# Patient Record
Sex: Female | Born: 1935 | Race: White | Hispanic: No | Marital: Married | Smoking: Never smoker
Health system: Southern US, Community
[De-identification: ages and names within clinical notes are randomized; demographics above are authoritative.]

## PROBLEM LIST (undated history)

## (undated) DIAGNOSIS — C50919 Malignant neoplasm of unspecified site of unspecified female breast: Secondary | ICD-10-CM

## (undated) DIAGNOSIS — I517 Cardiomegaly: Secondary | ICD-10-CM

## (undated) DIAGNOSIS — K5792 Diverticulitis of intestine, part unspecified, without perforation or abscess without bleeding: Secondary | ICD-10-CM

## (undated) HISTORY — DX: Malignant neoplasm of unspecified site of unspecified female breast: C50.919

## (undated) HISTORY — PX: ABDOMINAL HYSTERECTOMY: SHX81

## (undated) HISTORY — PX: CHOLECYSTECTOMY: SHX55

## (undated) HISTORY — PX: MASTECTOMY: SHX3

---

## 1998-12-11 ENCOUNTER — Other Ambulatory Visit: Admission: RE | Admit: 1998-12-11 | Discharge: 1998-12-11 | Payer: Self-pay | Admitting: Gastroenterology

## 1998-12-11 ENCOUNTER — Encounter (INDEPENDENT_AMBULATORY_CARE_PROVIDER_SITE_OTHER): Payer: Self-pay | Admitting: Specialist

## 1999-08-28 ENCOUNTER — Encounter (HOSPITAL_COMMUNITY): Payer: Self-pay | Admitting: Oncology

## 1999-08-28 ENCOUNTER — Encounter: Admission: RE | Admit: 1999-08-28 | Discharge: 1999-08-28 | Payer: Self-pay | Admitting: Oncology

## 2000-05-19 ENCOUNTER — Ambulatory Visit (HOSPITAL_COMMUNITY): Admission: RE | Admit: 2000-05-19 | Discharge: 2000-05-19 | Payer: Self-pay | Admitting: Family Medicine

## 2000-09-02 ENCOUNTER — Encounter: Admission: RE | Admit: 2000-09-02 | Discharge: 2000-09-02 | Payer: Self-pay | Admitting: Family Medicine

## 2000-09-02 ENCOUNTER — Encounter: Payer: Self-pay | Admitting: Family Medicine

## 2001-09-15 ENCOUNTER — Encounter: Payer: Self-pay | Admitting: Family Medicine

## 2001-09-15 ENCOUNTER — Encounter: Admission: RE | Admit: 2001-09-15 | Discharge: 2001-09-15 | Payer: Self-pay | Admitting: Family Medicine

## 2001-09-21 ENCOUNTER — Encounter: Payer: Self-pay | Admitting: Family Medicine

## 2001-09-21 ENCOUNTER — Encounter: Admission: RE | Admit: 2001-09-21 | Discharge: 2001-09-21 | Payer: Self-pay | Admitting: Family Medicine

## 2001-09-21 ENCOUNTER — Encounter (INDEPENDENT_AMBULATORY_CARE_PROVIDER_SITE_OTHER): Payer: Self-pay | Admitting: Specialist

## 2001-10-04 ENCOUNTER — Encounter: Payer: Self-pay | Admitting: General Surgery

## 2001-10-04 ENCOUNTER — Encounter: Admission: RE | Admit: 2001-10-04 | Discharge: 2001-10-04 | Payer: Self-pay | Admitting: General Surgery

## 2001-10-10 ENCOUNTER — Encounter (INDEPENDENT_AMBULATORY_CARE_PROVIDER_SITE_OTHER): Payer: Self-pay | Admitting: *Deleted

## 2001-10-10 ENCOUNTER — Encounter: Payer: Self-pay | Admitting: General Surgery

## 2001-10-11 ENCOUNTER — Inpatient Hospital Stay (HOSPITAL_COMMUNITY): Admission: AD | Admit: 2001-10-11 | Discharge: 2001-10-13 | Payer: Self-pay | Admitting: General Surgery

## 2002-01-04 ENCOUNTER — Encounter: Payer: Self-pay | Admitting: Urology

## 2002-01-04 ENCOUNTER — Encounter: Admission: RE | Admit: 2002-01-04 | Discharge: 2002-01-04 | Payer: Self-pay | Admitting: Urology

## 2002-02-09 ENCOUNTER — Encounter: Payer: Self-pay | Admitting: Urology

## 2002-02-09 ENCOUNTER — Ambulatory Visit (HOSPITAL_BASED_OUTPATIENT_CLINIC_OR_DEPARTMENT_OTHER): Admission: RE | Admit: 2002-02-09 | Discharge: 2002-02-09 | Payer: Self-pay | Admitting: Urology

## 2002-02-22 ENCOUNTER — Encounter: Payer: Self-pay | Admitting: Urology

## 2002-02-22 ENCOUNTER — Encounter: Admission: RE | Admit: 2002-02-22 | Discharge: 2002-02-22 | Payer: Self-pay | Admitting: Urology

## 2002-03-27 ENCOUNTER — Encounter: Payer: Self-pay | Admitting: Urology

## 2002-03-27 ENCOUNTER — Encounter: Admission: RE | Admit: 2002-03-27 | Discharge: 2002-03-27 | Payer: Self-pay | Admitting: Urology

## 2003-04-05 ENCOUNTER — Encounter: Admission: RE | Admit: 2003-04-05 | Discharge: 2003-04-05 | Payer: Self-pay | Admitting: Family Medicine

## 2003-04-16 ENCOUNTER — Ambulatory Visit (HOSPITAL_COMMUNITY): Admission: RE | Admit: 2003-04-16 | Discharge: 2003-04-16 | Payer: Self-pay | Admitting: Family Medicine

## 2003-05-10 ENCOUNTER — Encounter (INDEPENDENT_AMBULATORY_CARE_PROVIDER_SITE_OTHER): Payer: Self-pay | Admitting: *Deleted

## 2003-05-10 ENCOUNTER — Observation Stay (HOSPITAL_COMMUNITY): Admission: RE | Admit: 2003-05-10 | Discharge: 2003-05-11 | Payer: Self-pay | Admitting: General Surgery

## 2004-04-10 ENCOUNTER — Ambulatory Visit: Payer: Self-pay | Admitting: Family Medicine

## 2004-05-15 ENCOUNTER — Ambulatory Visit: Payer: Self-pay | Admitting: *Deleted

## 2004-07-15 ENCOUNTER — Ambulatory Visit: Payer: Self-pay | Admitting: Family Medicine

## 2004-09-01 ENCOUNTER — Ambulatory Visit: Payer: Self-pay | Admitting: Family Medicine

## 2004-10-13 ENCOUNTER — Ambulatory Visit: Payer: Self-pay | Admitting: Family Medicine

## 2004-12-18 ENCOUNTER — Ambulatory Visit: Payer: Self-pay | Admitting: Family Medicine

## 2005-01-22 ENCOUNTER — Ambulatory Visit: Payer: Self-pay | Admitting: Family Medicine

## 2005-01-23 ENCOUNTER — Ambulatory Visit (HOSPITAL_COMMUNITY): Admission: RE | Admit: 2005-01-23 | Discharge: 2005-01-23 | Payer: Self-pay | Admitting: Family Medicine

## 2005-01-27 ENCOUNTER — Ambulatory Visit: Payer: Self-pay | Admitting: Family Medicine

## 2005-01-29 ENCOUNTER — Ambulatory Visit: Payer: Self-pay | Admitting: Family Medicine

## 2005-01-29 ENCOUNTER — Ambulatory Visit: Payer: Self-pay | Admitting: Internal Medicine

## 2005-01-30 ENCOUNTER — Ambulatory Visit (HOSPITAL_COMMUNITY): Admission: RE | Admit: 2005-01-30 | Discharge: 2005-01-30 | Payer: Self-pay | Admitting: Family Medicine

## 2005-02-09 ENCOUNTER — Ambulatory Visit: Payer: Self-pay | Admitting: Family Medicine

## 2005-02-10 ENCOUNTER — Ambulatory Visit (HOSPITAL_COMMUNITY): Admission: RE | Admit: 2005-02-10 | Discharge: 2005-02-10 | Payer: Self-pay | Admitting: Family Medicine

## 2005-02-26 ENCOUNTER — Ambulatory Visit: Payer: Self-pay | Admitting: Gastroenterology

## 2005-03-23 ENCOUNTER — Ambulatory Visit: Payer: Self-pay | Admitting: Gastroenterology

## 2005-03-30 ENCOUNTER — Ambulatory Visit: Payer: Self-pay | Admitting: Gastroenterology

## 2005-04-01 ENCOUNTER — Ambulatory Visit: Payer: Self-pay | Admitting: Family Medicine

## 2005-04-09 ENCOUNTER — Ambulatory Visit: Payer: Self-pay | Admitting: Family Medicine

## 2005-06-04 ENCOUNTER — Ambulatory Visit: Payer: Self-pay | Admitting: Family Medicine

## 2005-07-29 ENCOUNTER — Ambulatory Visit: Payer: Self-pay | Admitting: Family Medicine

## 2005-08-10 ENCOUNTER — Ambulatory Visit: Payer: Self-pay | Admitting: Family Medicine

## 2005-09-03 ENCOUNTER — Ambulatory Visit: Payer: Self-pay | Admitting: Family Medicine

## 2005-09-10 ENCOUNTER — Ambulatory Visit: Payer: Self-pay | Admitting: Family Medicine

## 2005-11-04 ENCOUNTER — Ambulatory Visit: Payer: Self-pay | Admitting: Family Medicine

## 2005-12-16 ENCOUNTER — Ambulatory Visit: Payer: Self-pay | Admitting: Family Medicine

## 2006-02-09 ENCOUNTER — Ambulatory Visit: Payer: Self-pay | Admitting: Family Medicine

## 2006-02-18 ENCOUNTER — Ambulatory Visit: Payer: Self-pay | Admitting: Family Medicine

## 2006-03-26 ENCOUNTER — Ambulatory Visit: Payer: Self-pay | Admitting: Family Medicine

## 2006-04-28 ENCOUNTER — Ambulatory Visit: Payer: Self-pay | Admitting: Family Medicine

## 2006-05-31 ENCOUNTER — Ambulatory Visit: Payer: Self-pay | Admitting: Family Medicine

## 2006-07-05 ENCOUNTER — Ambulatory Visit: Payer: Self-pay | Admitting: Family Medicine

## 2006-07-21 ENCOUNTER — Ambulatory Visit: Payer: Self-pay | Admitting: Family Medicine

## 2006-08-12 ENCOUNTER — Ambulatory Visit: Payer: Self-pay | Admitting: Family Medicine

## 2006-09-09 ENCOUNTER — Ambulatory Visit: Payer: Self-pay | Admitting: Family Medicine

## 2006-09-23 IMAGING — CR DG SHOULDER 2+V*R*
3 series · 3 of 3 positions shown · non-contrast
Comparison: none

CLINICAL DATA: Chronic right shoulder pain.  No acute injury.   
 RIGHT SHOULDER COMPLETE ? 3 VIEWS:

[view not recorded (1 of 3)]
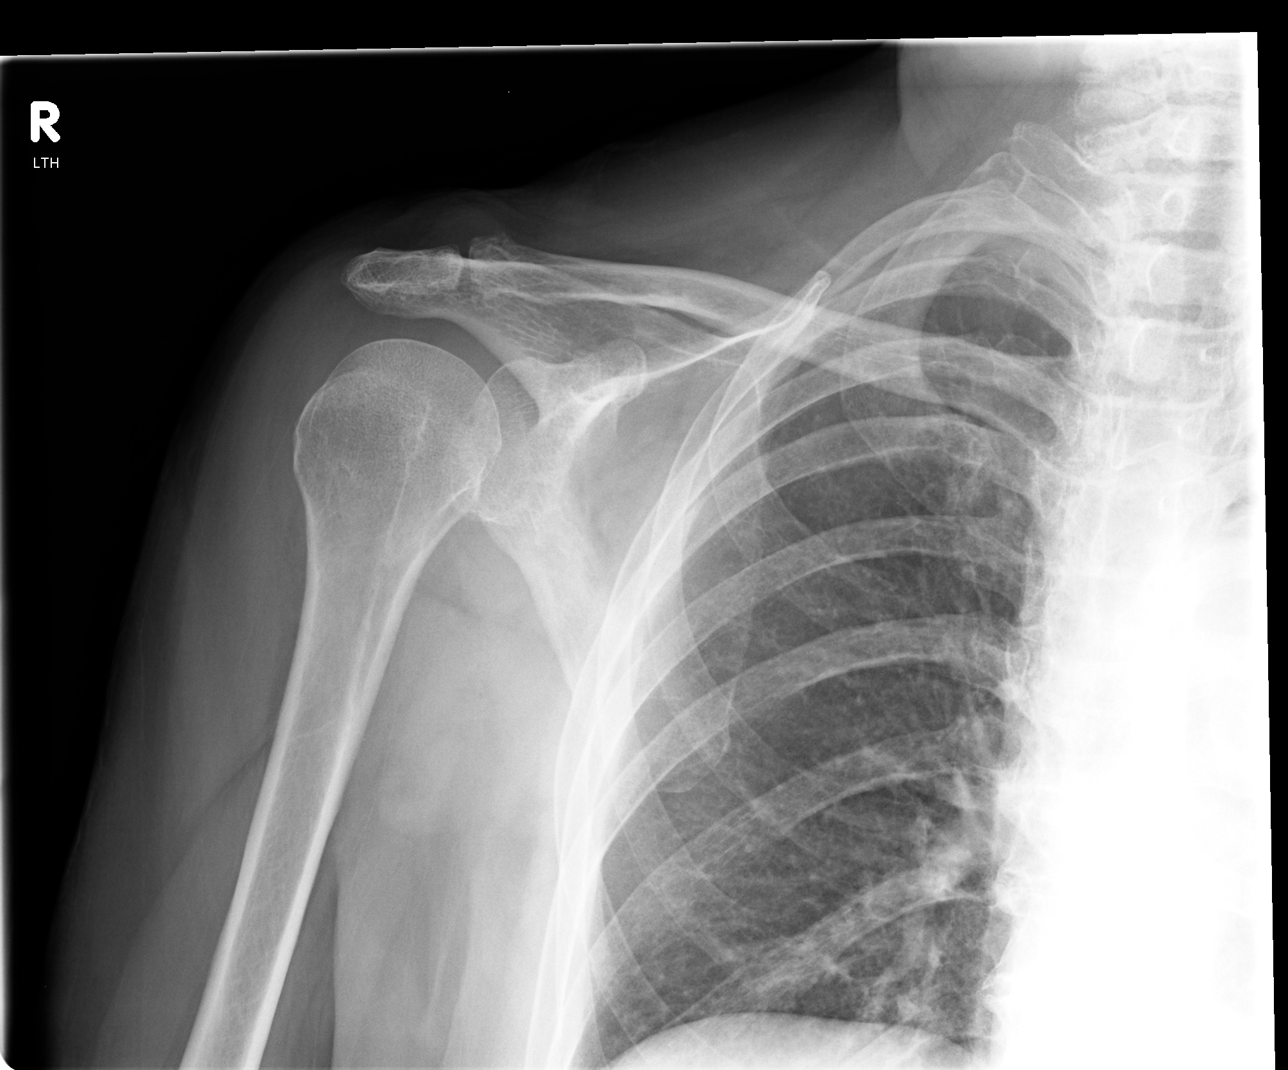

[view not recorded (2 of 3)]
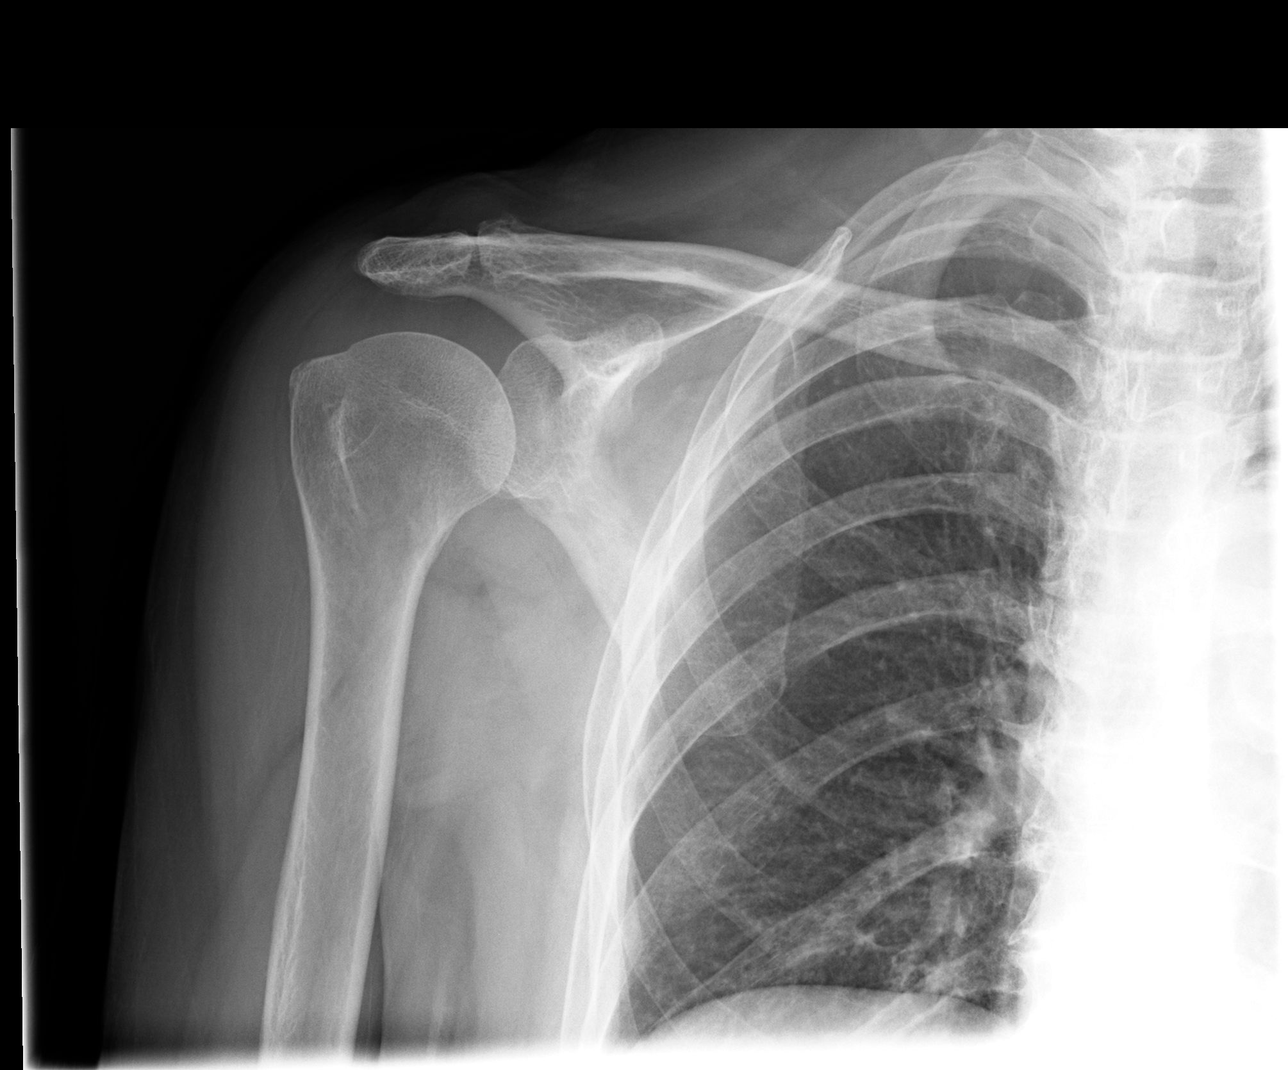

[view not recorded (3 of 3)]
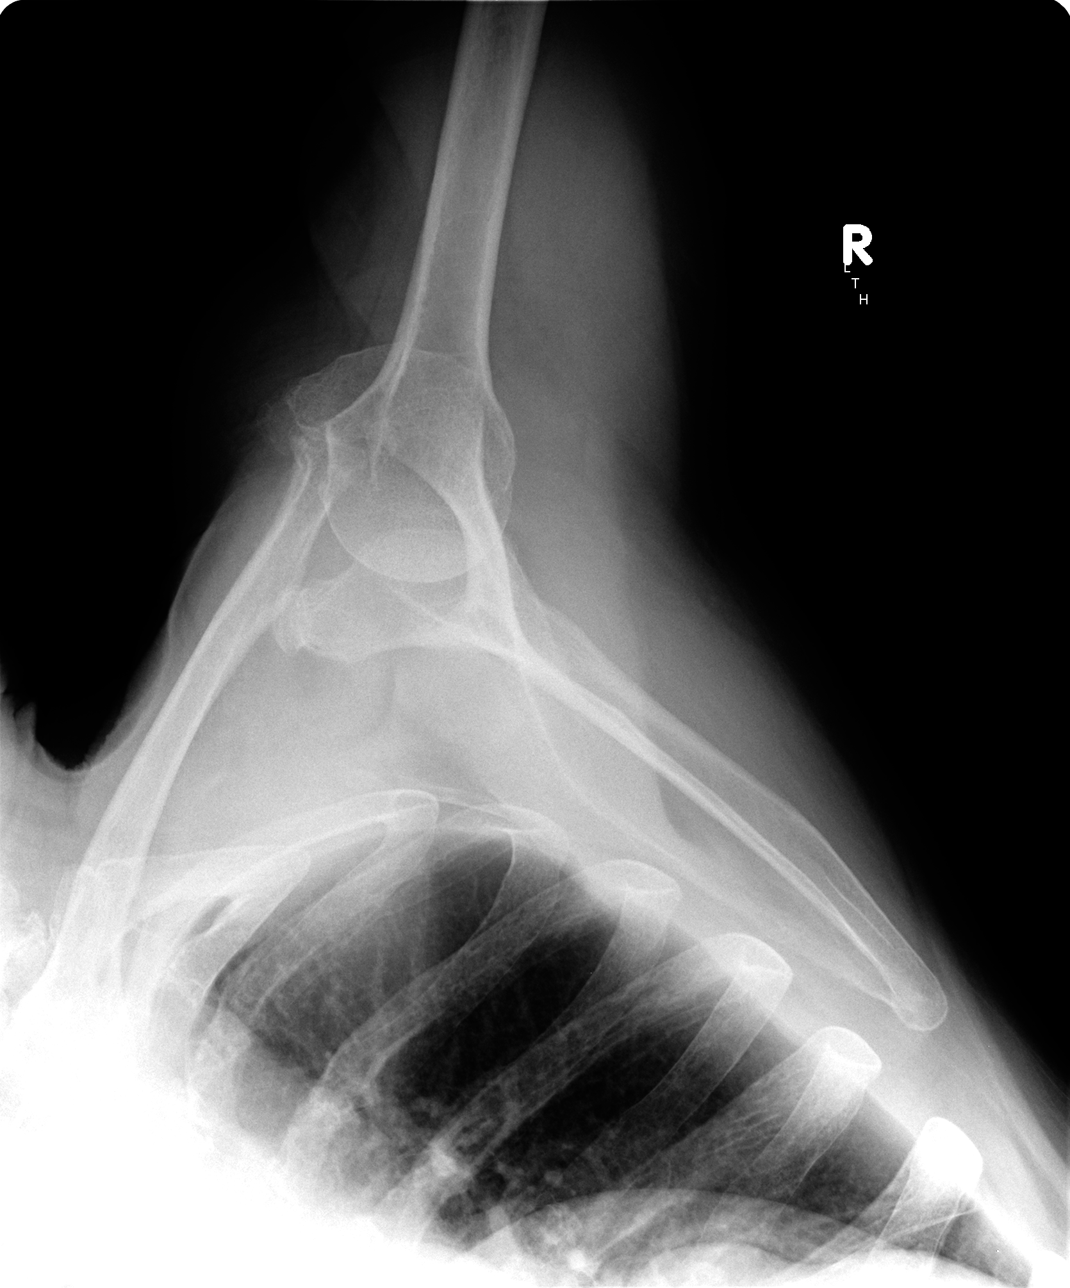

[3 of 3 positions shown; findings below may reference images not displayed]

FINDINGS: There are moderate degenerative changes of the acromioclavicular joint with as spur emanating off the inferior aspect of the distal clavicle.  This could make the patient subject to rotator cuff stress.  Is there clinical evidence for impingement?  No tendon calcification.  No acute change.
IMPRESSION: Degenerative changes of the A/C joint with a prominent spur emanating off the distal clavicle ? question impingement syndrome.

## 2006-10-19 ENCOUNTER — Ambulatory Visit: Payer: Self-pay | Admitting: Family Medicine

## 2006-10-26 ENCOUNTER — Ambulatory Visit: Payer: Self-pay | Admitting: Family Medicine

## 2006-12-28 ENCOUNTER — Emergency Department (HOSPITAL_COMMUNITY): Admission: EM | Admit: 2006-12-28 | Discharge: 2006-12-28 | Payer: Self-pay | Admitting: Emergency Medicine

## 2008-08-27 IMAGING — CR DG CHEST 2V
2 series · 2 of 2 positions shown · non-contrast
Comparison: none

CLINICAL DATA: Hypertension

Chest 2 view:
No previous for comparison. Lungs clear. Heart size upper limits normal.
Vascular clips in the right upper abdomen. No effusion. Spondylitic changes in
the thoracic spine.

[w chest pa]
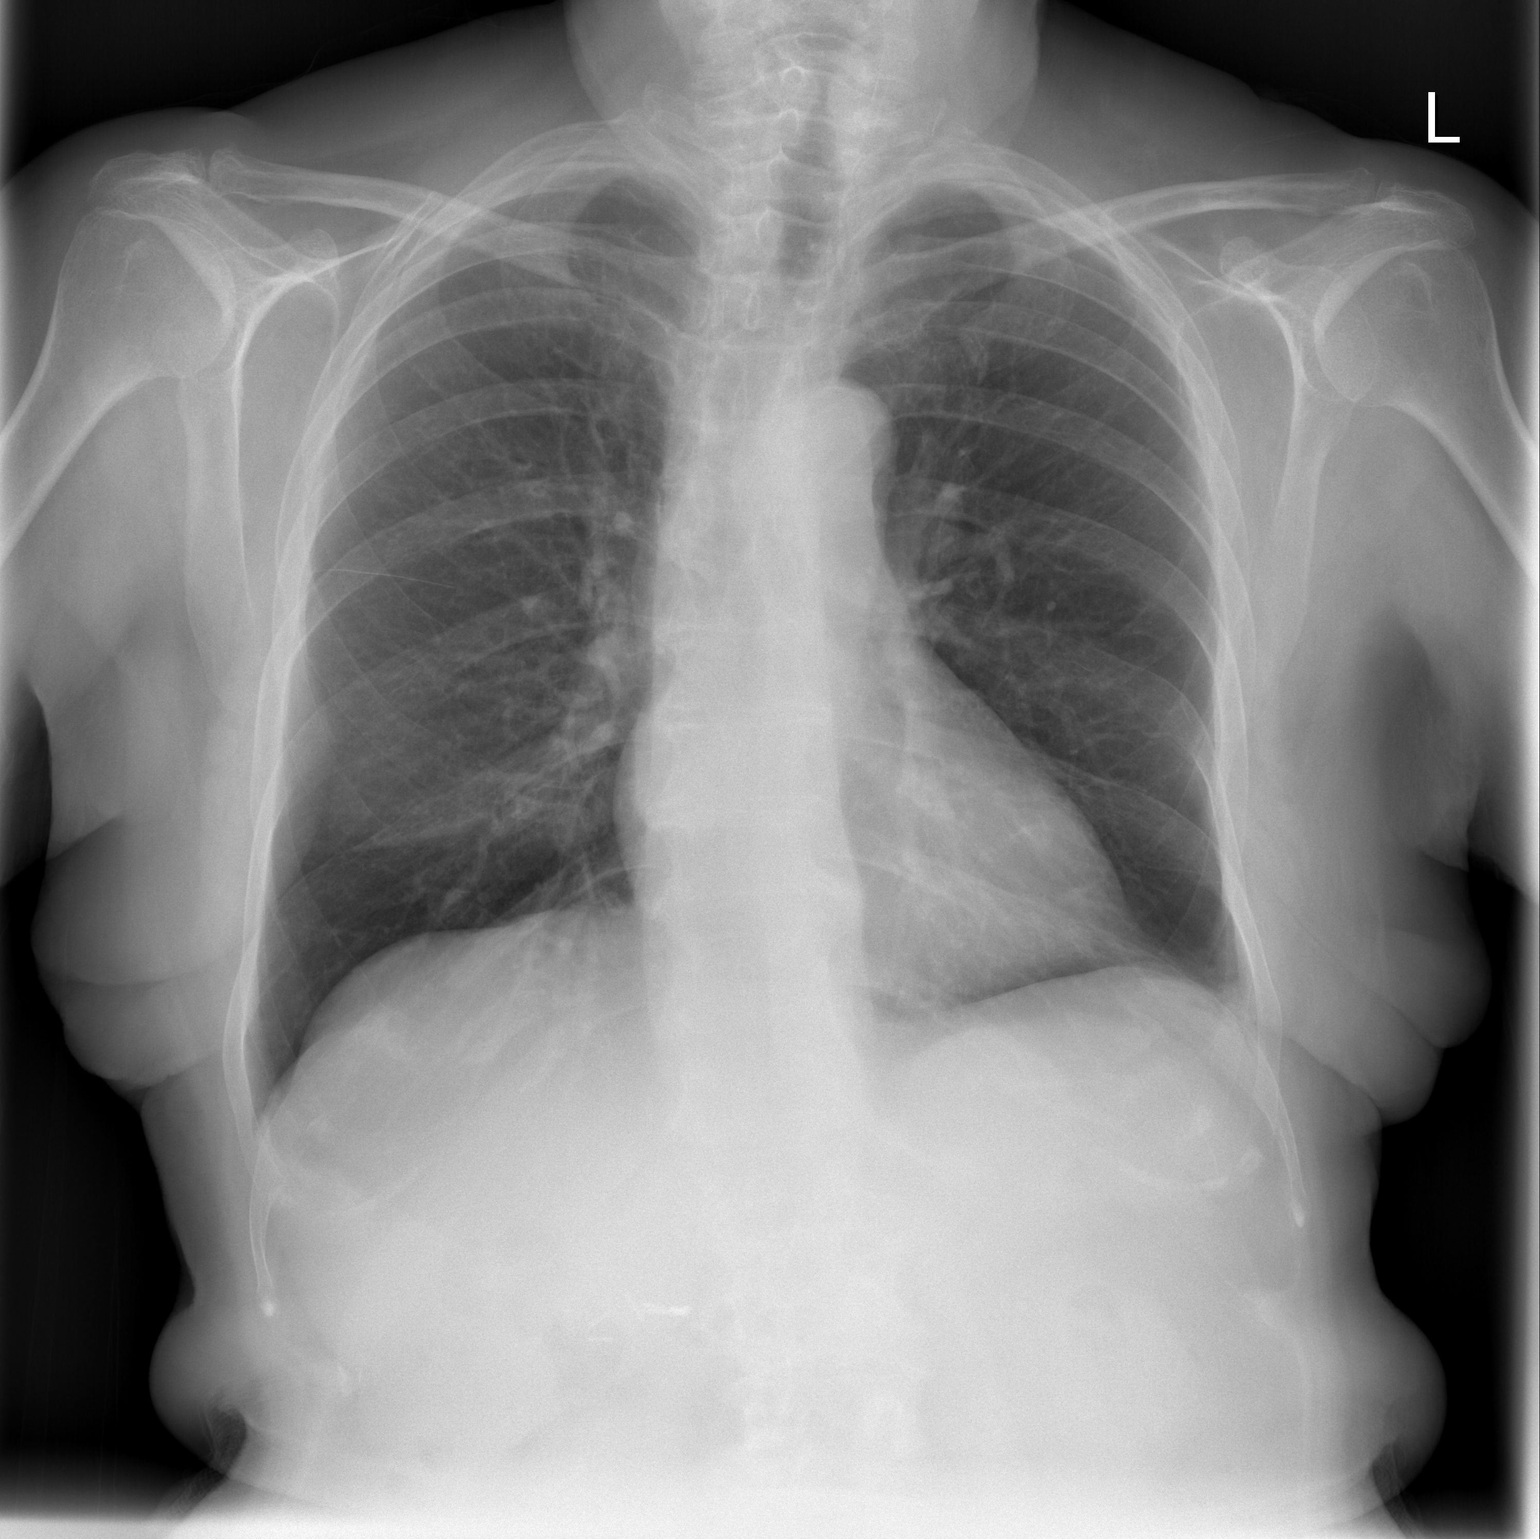

[w chest lat]
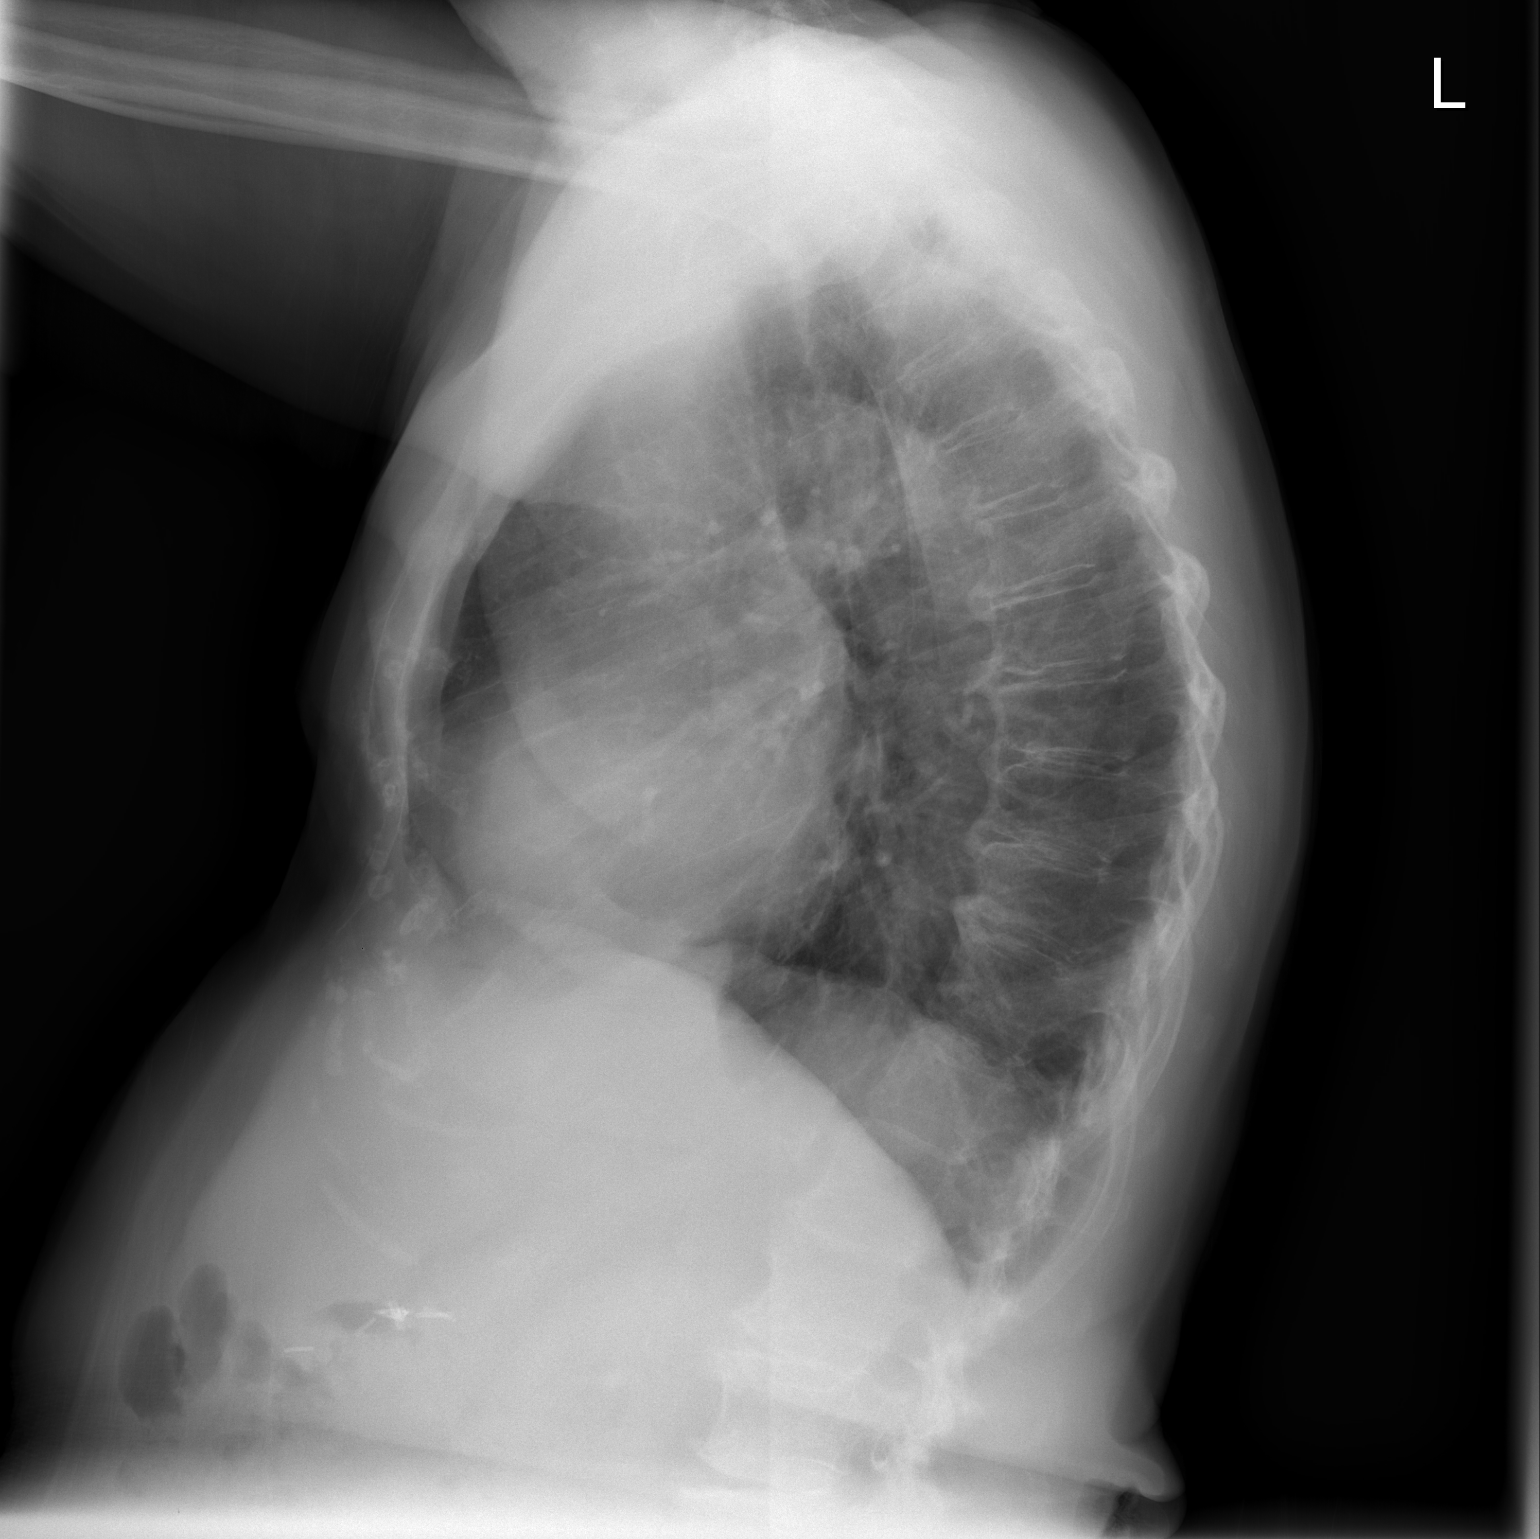

[2 of 2 positions shown; findings below may reference images not displayed]

IMPRESSION: 1. No acute disease

## 2009-07-08 ENCOUNTER — Emergency Department (HOSPITAL_COMMUNITY): Admission: EM | Admit: 2009-07-08 | Discharge: 2009-07-08 | Payer: Self-pay | Admitting: Family Medicine

## 2010-04-19 ENCOUNTER — Emergency Department (HOSPITAL_COMMUNITY): Admission: EM | Admit: 2010-04-19 | Discharge: 2010-04-19 | Payer: Self-pay | Admitting: Emergency Medicine

## 2010-06-15 ENCOUNTER — Encounter: Payer: Self-pay | Admitting: Family Medicine

## 2010-10-10 NOTE — Op Note (Signed)
[place]. [facility]  Patient:    ZAKARIAH, DEJARNETTE Visit Number: [id number] MRN: [id number]          Service Type: DSU Location: [phone] Attending Physician:  Henrene Dodge Dictated by:   Anselm Pancoast. Zachery Dakins, M.D. Proc. Date: 10/10/01 Admit Date:  10/10/2001                             Operative Report  PREOPERATIVE DIAGNOSIS:  Carcinoma of the right breast.  PROCEDURE:  Right total mastectomy and sentinel node.  ANESTHESIA:  General anesthesia.  SURGEON:  Anselm Pancoast. Zachery Dakins, M.D.  ASSISTANT:  Donnie Coffin. Samuella Cota, M.D.  HISTORY:  Tami Blass is an 77 year old Caucasian female who I did a left modified radical mastectomy approximately, I think about seven years ago, when she presented with a left breast mass, and at that time she was evaluated with a bone scan and found to have an abnormal area on the cranium that was excised and found to be a benign process, then underwent a left radical mastectomy. She was on tamoxifen for about five years.  I think she is now approximately 10 years following the original surgery, and on a routine mammogram was shown to have a spiculated area in the right breast at the approximately 9 oclock position that was cored by Dr. Doloris Hall with the findings of this being invasive carcinoma.  The patient desires to proceed on with a total mastectomy for symmetry, since she said she has basically been unbalanced ever since her previous mastectomy on the left.  The tumor itself is fairly small, and she is now followed by Dr. Lysbeth Galas of medicine as her regular physician.  She is on antihypertensive medications.  DESCRIPTION OF PROCEDURE:  The patient was taken to the operative suite. Kefzol 1 g was administered.  She had PAS stockings and positioned.  She had already been injected in the subareolar area with the radionuclide in nuclear medicine and preoperatively I checked the axilla, really finding that  there were two areas, one that was a high count of about 500 or 600, the other had a count of 300, that were probably about 3 cm apart.  One was right up under the lateral edge of the pectoralis major and the other was more lateral, which was the higher count.  I after induction of general anesthesia injected the subareolar area with approximately 4 cc of blue dye and then draped her after prepping her with surgical scrub and solution in a sterile manner.  I marked the skin so that we could do a similar incision to her left and then the area kind of over the axilla was dissected out that had the hottest lymph node first.  This was a blue node, not an extremely large node, and had a count of about [phone], and the second lymph node that was warm was removed at completion of the total mastectomy.  The superior and inferior flaps were developed, and the bleeders were tied with 4-0 Vicryl and the breast was then removed from the chest wall, and we worked up to the area where we had done the axillary sampling.  I then dissected out the warm node, and it had a count of about 350.  It was a similar-appearing node, both blue, nothing obviously metastatic in this node either.  The sentinel node first biopsy report came back negative.  I did not dissect out the  other axillary nodes, there were probably two or three other nodes in the low axillary dissection, but will send them all for permanent examination.  We will get estrogen and progesterone receptors on the primary.  The incision was thoroughly irrigated. Good hemostasis had been obtained.  Two 19 Blake drains were placed, one in the axilla area and the other one across the superior aspect of the pectoralis major, and then the subcutaneous incision with interrupted 4-0 Vicryl and the skin closed with staples.  The skin comes together without any tension, and hopefully she will have less pain than she did, since we really did not do a completion  axillary dissection.  Estimated blood loss was probably 150 cc, and a sterile occlusive dressing was applied.  Vaseline gauze over the incision and around the drains.  They had been sutured to the skin with 3-0 nylon.  The patient will be kept overnight.  We will make a decision in the morning whether to let her go home or whether she really ought to be here another day according to how she is feeling, not only more of her age than the degree of surgery. Dictated by:   Anselm Pancoast. Zachery Dakins, M.D. Attending Physician:  Henrene Dodge DD:  10/10/01 TD:  10/12/01 Job: [id number]

## 2010-10-10 NOTE — Op Note (Signed)
NAME:  Erica Austin, Erica Austin                        ACCOUNT NO.:  [id number]   MEDICAL RECORD NO.:  [id number]                   PATIENT TYPE:  AMB   LOCATION:  DAY                                  FACILITY:  [facility]   PHYSICIAN:  Anselm Pancoast. Zachery Dakins, M.D.          DATE OF BIRTH:  12-15-1917   DATE OF PROCEDURE:  05/10/2003  DATE OF DISCHARGE:                                 OPERATIVE REPORT   PREOPERATIVE DIAGNOSIS:  Symptomatic gallstones.   POSTOPERATIVE DIAGNOSIS:  Symptomatic gallstones.   OPERATION:  Laparoscopic cholecystectomy with cholangiogram.   General anesthesia.   SURGEON:  Anselm Pancoast. Zachery Dakins, M.D.   ASSISTANT:  Ollen Gross. Carolynne Edouard, M.D.   HISTORY:  Erica Austin is an 77 year old Caucasian female whom I first met  probably about 20 years ago when she had a cancer in the left breast.  Approximately a year ago she had a second cancer in the right breast and has  done satisfactorily following the surgery.  Approximately a month ago she  had an episode of epigastric pain and was evaluated for cardiac __________  noted, and then an ultrasound was performed, which showed numerous stones  within the gallbladder.  I saw her in the office last week, and she wanted  to proceed on with cholecystectomy promptly, fearing that she might have  another episode of pain.  She had had a mild episode of pain __________ that  was minimally compared to the previous.  Her liver function studies are  unremarkable at this time, as is chest x-ray, and EKG showed nonspecific ST-  T wave abnormalities.   Preoperatively she was given 3 g of Unasyn and was taken to the operative  suite.  Induction of general anesthesia and endotracheal tube, oral tube to  the stomach.  The abdomen was prepped with Betadine surgical scrub and  solution and draped in a sterile manner.  A small incision was made below  the umbilicus and the fascia was fairly thin, and I very carefully entered  into the peritoneal  cavity.  A pursestring suture was placed of 0 Vicryl and  the Hasson cannula was introduced.  The carbon dioxide was infused and the  gallbladder was distended, kind of subacutely inflamed.  The liver was  normal on inspection of the right and left lobes.  The upper 10 mm trocar  was placed in the subxiphoid area and two lateral 5 mm trocars were placed  by Dr. Carolynne Edouard.  The gallbladder was grasped and retracted up and outward.  The  omentum that was adherent to this was kind of carefully stripped down and  there was a little bleeder on this, so it was hemoclipped on the omental  side and it was cauterized on the pelvic side.  The proximal portion of the  gallbladder was then identified.  There was a stone impacted in the neck of  the gallbladder in the cystic duct area.  It sort  of encompassed the cystic  duct, clipped it flush with its junction, and then the cystic artery was  identified and this was doubly clipped at that time.  X-ray was obtained  through the proximal cystic duct with a Taut catheter.  X-rays showed good,  prompt filling of the extrahepatic biliary system, good flow into the  duodenum, and no abnormalities.  The cystic duct catheter was removed and  the cystic duct stump was triply clipped, divided, and then a third clip was  placed on the cystic artery.  This was divided distally to the two proximal  clips.  The gallbladder was freed from its bed.  Good hemostasis was  obtained.  There was a little vessel that may have been a posterior artery  that was clipped up on the lateral liver bed area.  The good hemostasis was  obtained.  The irrigated fluid was aspirated, it was clear, and then the  gallbladder was placed in an EndoCatch bag.  We placed a camera in the upper  10 mm port, withdrew the gallbladder up to it, pulled the gallbladder  partially through, opened it, removed the numerous stones, and then it came  through without enlarging the fascial defect.  The  figure-of-eight suture in  addition to the pursestring was placed in the subhepatic incision, and these  were both tied and Marcaine was placed.  The other trocar sites had been  anesthetized when they were placed with Marcaine.  I did place a single  fascial stitch in the upper subxiphoid area.  Subcutaneous wounds were  closed with 4-0 Vicryl and Benzoin and Steri-Strips in the skin.  The  patient tolerated the procedure nicely.  He was extubated and sent to the  recovery room in stable postop condition.  Hopefully, she will have no  further problems and should be able to be discharged in the a.m.                                               Anselm Pancoast. Zachery Dakins, M.D.    WJW/MEDQ  D:  05/10/2003  T:  05/10/2003  Job:  [id number]   cc:   Delaney Meigs, M.D.  [address]  [place]  [place] [address]  Fax: [phone]

## 2011-03-09 LAB — DIFFERENTIAL
Eosinophils Absolute: 0.1
Eosinophils Relative: 2
Lymphocytes Relative: 30
Lymphs Abs: 2.3
Monocytes Absolute: 0.6
Neutrophils Relative %: 60

## 2011-03-09 LAB — BASIC METABOLIC PANEL
Chloride: 105
GFR calc Af Amer: >60
Sodium: 139

## 2011-03-09 LAB — CBC
HCT: 32.2 — ABNORMAL LOW
MCHC: 34.4
WBC: 7.6

## 2011-08-07 DIAGNOSIS — H348192 Central retinal vein occlusion, unspecified eye, stable: Secondary | ICD-10-CM | POA: Insufficient documentation

## 2011-09-25 DIAGNOSIS — H3582 Retinal ischemia: Secondary | ICD-10-CM | POA: Insufficient documentation

## 2011-09-25 DIAGNOSIS — H348392 Tributary (branch) retinal vein occlusion, unspecified eye, stable: Secondary | ICD-10-CM | POA: Insufficient documentation

## 2012-02-10 ENCOUNTER — Other Ambulatory Visit: Payer: Self-pay

## 2012-02-10 ENCOUNTER — Inpatient Hospital Stay (HOSPITAL_COMMUNITY): Payer: Medicare Other

## 2012-02-10 ENCOUNTER — Inpatient Hospital Stay (HOSPITAL_COMMUNITY)
Admission: EM | Admit: 2012-02-10 | Discharge: 2012-02-17 | DRG: 871 | Disposition: A | Discharge status: Home / Self Care | Payer: Medicare Other | Attending: Cardiology | Admitting: Cardiology

## 2012-02-10 ENCOUNTER — Emergency Department (HOSPITAL_COMMUNITY): Payer: Medicare Other

## 2012-02-10 ENCOUNTER — Encounter (HOSPITAL_COMMUNITY): Payer: Self-pay

## 2012-02-10 DIAGNOSIS — A419 Sepsis, unspecified organism: Principal | ICD-10-CM | POA: Diagnosis present

## 2012-02-10 DIAGNOSIS — Z853 Personal history of malignant neoplasm of breast: Secondary | ICD-10-CM

## 2012-02-10 DIAGNOSIS — K5732 Diverticulitis of large intestine without perforation or abscess without bleeding: Secondary | ICD-10-CM | POA: Diagnosis present

## 2012-02-10 DIAGNOSIS — R55 Syncope and collapse: Secondary | ICD-10-CM

## 2012-02-10 DIAGNOSIS — I129 Hypertensive chronic kidney disease with stage 1 through stage 4 chronic kidney disease, or unspecified chronic kidney disease: Secondary | ICD-10-CM | POA: Diagnosis present

## 2012-02-10 DIAGNOSIS — J189 Pneumonia, unspecified organism: Secondary | ICD-10-CM | POA: Diagnosis present

## 2012-02-10 DIAGNOSIS — R579 Shock, unspecified: Secondary | ICD-10-CM | POA: Diagnosis present

## 2012-02-10 DIAGNOSIS — E872 Acidosis: Secondary | ICD-10-CM

## 2012-02-10 DIAGNOSIS — I2510 Atherosclerotic heart disease of native coronary artery without angina pectoris: Secondary | ICD-10-CM | POA: Diagnosis present

## 2012-02-10 DIAGNOSIS — I252 Old myocardial infarction: Secondary | ICD-10-CM

## 2012-02-10 DIAGNOSIS — H5440 Blindness, one eye, unspecified eye: Secondary | ICD-10-CM | POA: Diagnosis present

## 2012-02-10 DIAGNOSIS — R570 Cardiogenic shock: Secondary | ICD-10-CM

## 2012-02-10 DIAGNOSIS — R001 Bradycardia, unspecified: Secondary | ICD-10-CM

## 2012-02-10 DIAGNOSIS — I69998 Other sequelae following unspecified cerebrovascular disease: Secondary | ICD-10-CM

## 2012-02-10 DIAGNOSIS — N189 Chronic kidney disease, unspecified: Secondary | ICD-10-CM | POA: Diagnosis present

## 2012-02-10 HISTORY — DX: Diverticulitis of intestine, part unspecified, without perforation or abscess without bleeding: K57.92

## 2012-02-10 HISTORY — DX: Cardiomegaly: I51.7

## 2012-02-10 LAB — CBC WITH DIFFERENTIAL/PLATELET
Basophils Absolute: 0 10*3/uL (ref 0.0–0.1)
Basophils Relative: 0 % (ref 0–1)
Eosinophils Relative: 1 % (ref 0–5)
Hemoglobin: 10.7 g/dL — ABNORMAL LOW (ref 12.0–15.0)
MCH: 37.4 pg — ABNORMAL HIGH (ref 26.0–34.0)
MCHC: 34.7 g/dL (ref 30.0–36.0)
MCV: 107.7 fL — ABNORMAL HIGH (ref 78.0–100.0)
Monocytes Absolute: 0.9 10*3/uL (ref 0.1–1.0)
Monocytes Relative: 6 % (ref 3–12)
Platelets: 297 10*3/uL (ref 150–400)
RBC: 2.86 MIL/uL — ABNORMAL LOW (ref 3.87–5.11)

## 2012-02-10 LAB — URINALYSIS, ROUTINE W REFLEX MICROSCOPIC
Bilirubin Urine: NEGATIVE
Glucose, UA: NEGATIVE mg/dL
Leukocytes, UA: NEGATIVE
Leukocytes, UA: NEGATIVE
Nitrite: NEGATIVE
Protein, ur: NEGATIVE mg/dL
Specific Gravity, Urine: 1.019 (ref 1.005–1.030)
pH: 5.5 (ref 5.0–8.0)
pH: 6 (ref 5.0–8.0)

## 2012-02-10 LAB — CBC
HCT: 30.1 % — ABNORMAL LOW (ref 36.0–46.0)
MCH: 39.2 pg — ABNORMAL HIGH (ref 26.0–34.0)
MCV: 110.3 fL — ABNORMAL HIGH (ref 78.0–100.0)
Platelets: 293 10*3/uL (ref 150–400)
RDW: 13.2 % (ref 11.5–15.5)

## 2012-02-10 LAB — BLOOD GAS, ARTERIAL
Acid-base deficit: 8 mmol/L — ABNORMAL HIGH (ref 0.0–2.0)
Drawn by: 27407
O2 Content: 6 L/min
O2 Saturation: 86.2 %
Patient temperature: 37

## 2012-02-10 LAB — BASIC METABOLIC PANEL
BUN: 27 mg/dL — ABNORMAL HIGH (ref 6–23)
Calcium: 9.4 mg/dL (ref 8.4–10.5)
Chloride: 100 mEq/L (ref 96–112)
GFR calc non Af Amer: 26 mL/min — ABNORMAL LOW (ref >90)
Potassium: 3.7 mEq/L (ref 3.5–5.1)
Sodium: 138 mEq/L (ref 135–145)

## 2012-02-10 LAB — PROTIME-INR: Prothrombin Time: 14.1 seconds (ref 11.6–15.2)

## 2012-02-10 LAB — APTT: aPTT: 29 seconds (ref 24–37)

## 2012-02-10 LAB — MAGNESIUM: Magnesium: 2.3 mg/dL (ref 1.5–2.5)

## 2012-02-10 LAB — TROPONIN I: Troponin I: <0.3 ng/mL (ref <0.30)

## 2012-02-10 MED ORDER — SODIUM CHLORIDE 0.9 % IV SOLN
Freq: Once | INTRAVENOUS | Status: AC
  Administered 2012-02-10: 10 mL via INTRAVENOUS

## 2012-02-10 MED ORDER — DEXTROSE 5 % IV SOLN
500.0000 mg | INTRAVENOUS | Status: DC
  Filled 2012-02-10: qty 500

## 2012-02-10 MED ORDER — METRONIDAZOLE IN NACL 5-0.79 MG/ML-% IV SOLN
500.0000 mg | Freq: Three times a day (TID) | INTRAVENOUS | Status: DC
  Administered 2012-02-10 – 2012-02-16 (×16): 500 mg via INTRAVENOUS
  Filled 2012-02-10 (×21): qty 100

## 2012-02-10 MED ORDER — SODIUM CHLORIDE 0.9 % IV BOLUS (SEPSIS)
1000.0000 mL | Freq: Once | INTRAVENOUS | Status: AC
  Administered 2012-02-10: 1000 mL via INTRAVENOUS

## 2012-02-10 MED ORDER — ONDANSETRON HCL 4 MG/2ML IJ SOLN
4.0000 mg | Freq: Three times a day (TID) | INTRAMUSCULAR | Status: AC | PRN
  Administered 2012-02-11: 4 mg via INTRAVENOUS
  Filled 2012-02-10: qty 2

## 2012-02-10 MED ORDER — DOPAMINE-DEXTROSE 3.2-5 MG/ML-% IV SOLN
2.0000 ug/kg/min | Freq: Once | INTRAVENOUS | Status: AC
  Administered 2012-02-10: 2 ug/kg/min via INTRAVENOUS
  Filled 2012-02-10: qty 250

## 2012-02-10 MED ORDER — DEXTROSE 5 % IV SOLN
500.0000 mg | INTRAVENOUS | Status: DC
  Administered 2012-02-10: 500 mg via INTRAVENOUS
  Filled 2012-02-10 (×2): qty 500

## 2012-02-10 MED ORDER — HEPARIN (PORCINE) IN NACL 100-0.45 UNIT/ML-% IJ SOLN
900.0000 [IU]/h | INTRAMUSCULAR | Status: DC
  Administered 2012-02-10: 900 [IU]/h via INTRAVENOUS
  Filled 2012-02-10: qty 250

## 2012-02-10 MED ORDER — DEXTROSE 5 % IV SOLN
1.0000 g | INTRAVENOUS | Status: DC
  Administered 2012-02-11 – 2012-02-16 (×6): 1 g via INTRAVENOUS
  Filled 2012-02-10 (×8): qty 10

## 2012-02-10 MED ORDER — DEXTROSE 5 % IV SOLN
500.0000 mg | INTRAVENOUS | Status: DC
  Administered 2012-02-10 – 2012-02-15 (×6): 500 mg via INTRAVENOUS
  Filled 2012-02-10 (×7): qty 500

## 2012-02-10 MED ORDER — ATROPINE SULFATE 0.1 MG/ML IJ SOLN
0.5000 mg | Freq: Once | INTRAMUSCULAR | Status: AC
  Administered 2012-02-10: 0.5 mg via INTRAVENOUS
  Filled 2012-02-10: qty 10

## 2012-02-10 MED ORDER — CEFTRIAXONE SODIUM 1 G IJ SOLR
1.0000 g | Freq: Once | INTRAMUSCULAR | Status: AC
  Administered 2012-02-10: 1 g via INTRAVENOUS
  Filled 2012-02-10: qty 10

## 2012-02-10 MED ORDER — ONDANSETRON HCL 4 MG/2ML IJ SOLN
4.0000 mg | Freq: Once | INTRAMUSCULAR | Status: AC
  Administered 2012-02-10: 4 mg via INTRAVENOUS
  Filled 2012-02-10: qty 2

## 2012-02-10 MED ORDER — PANTOPRAZOLE SODIUM 40 MG IV SOLR
40.0000 mg | Freq: Every day | INTRAVENOUS | Status: DC
  Administered 2012-02-10 – 2012-02-13 (×4): 40 mg via INTRAVENOUS
  Filled 2012-02-10 (×5): qty 40

## 2012-02-10 MED ORDER — HEPARIN BOLUS VIA INFUSION
3000.0000 [IU] | Freq: Once | INTRAVENOUS | Status: AC
  Administered 2012-02-10: 3000 [IU] via INTRAVENOUS

## 2012-02-10 MED ORDER — ASPIRIN 81 MG PO CHEW
324.0000 mg | CHEWABLE_TABLET | ORAL | Status: AC
  Filled 2012-02-10: qty 4

## 2012-02-10 MED ORDER — METRONIDAZOLE IN NACL 5-0.79 MG/ML-% IV SOLN
500.0000 mg | Freq: Once | INTRAVENOUS | Status: AC
Start: 2012-02-10 — End: 2012-02-10
  Administered 2012-02-10: 500 mg via INTRAVENOUS
  Filled 2012-02-10: qty 100

## 2012-02-10 MED ORDER — ASPIRIN 300 MG RE SUPP
300.0000 mg | RECTAL | Status: AC
  Administered 2012-02-10: 300 mg via RECTAL
  Filled 2012-02-10: qty 1

## 2012-02-10 MED ORDER — SODIUM CHLORIDE 0.9 % IV SOLN: 250.0000 mL | INTRAVENOUS | Status: DC | PRN

## 2012-02-10 NOTE — ED Notes (Signed)
Attempted x 2 for IV access without success.  

## 2012-02-10 NOTE — ED Notes (Signed)
respiratory notified of blood gas

## 2012-02-10 NOTE — ED Provider Notes (Addendum)
History   This chart was scribed for Derwood Kaplan, MD by Gerlean Ren. This patient was seen in room APA04/APA04 and the patient's care was started at 5:18PM.   CSN: [id number]  Arrival date & time 02/10/12  1705   First MD Initiated Contact with Patient 02/10/12 1714      Chief Complaint  Patient presents with  . Near Syncope    (Consider location/radiation/quality/duration/timing/severity/associated sxs/prior treatment) The history is provided by the patient. No language interpreter was used.   Erica Austin is a 78 y.o. female who presents to the Emergency Department complaining of near syncope while home resting 5.5 hours ago with associated sweating during event.  Pt reports that she has not been feeling well since the event.  Pt denies chest pain, nausea, emesis, fever, chills, and bloody stools as associated.  Pt reports feeling SOB upon exam, but states she does not normally experience with everyday activities.  Pt has h/o HTN but has low BP during exam.  Pt reports she has been taking her BP meds regularly.   Pt experienced sudden nausea during exam and one episode of dry heaving. PCP is Dr. Lysbeth Galas in [place].  Past Medical History  Diagnosis Date  . Heart size increased   . Diverticulitis   . Cancer     Past Surgical History  Procedure Date  . Abdominal hysterectomy   . Cholecystectomy   . Mastectomy     No family history on file.  History  Substance Use Topics  . Smoking status: Never Smoker   . Smokeless tobacco: Not on file  . Alcohol Use: No    No OB history provided.  Review of Systems  All other systems reviewed and are negative.    Allergies  Codeine  Home Medications  No current outpatient prescriptions on file.  BP 84/43  Pulse 49  Resp 18  Ht 5\' 3"  (1.6 m)  Wt 135 lb (61.236 kg)  BMI 23.91 kg/m2  SpO2 94%  Physical Exam  Nursing note and vitals reviewed. Constitutional: She is oriented to person, place, and time. She appears  well-developed and well-nourished.  HENT:  Head: Normocephalic and atraumatic.  Neck: Normal range of motion. Neck supple. No tracheal deviation present.  Cardiovascular: Normal rate, regular rhythm and normal heart sounds.        1+ bilateral radial pulse. Mild pericardial effusion. Poor conductivity. Lower right ventricular collapse on diastole. Could not appreciate aorta. Apical long axis.  Pulmonary/Chest: Effort normal.       Bibasilar crackles in lower third.   Abdominal: Soft. Bowel sounds are normal.       LLQ tenderness. No palpable masses. No bruits.  Musculoskeletal: Normal range of motion. She exhibits no edema.  Neurological: She is alert and oriented to person, place, and time.  Skin: Skin is warm and dry.       Upper extremity skin cold to touch.  Psychiatric: She has a normal mood and affect.    ED Course  Procedures (including critical care time) DIAGNOSTIC STUDIES: Oxygen Saturation is 94% on room air, adequate by my interpretation.    COORDINATION OF CARE: 5:38PM- Discussed admission for further observation.      Labs Reviewed  CBC WITH DIFFERENTIAL  BASIC METABOLIC PANEL  URINALYSIS, ROUTINE W REFLEX MICROSCOPIC  TROPONIN I  MAGNESIUM  PHOSPHORUS   No results found.    No diagnosis found.    MDM  Medical screening examination/treatment/procedure(s) were performed by me as the supervising physician.  Scribe service was utilized for documentation only.   Date: 02/10/2012  Rate:44  Rhythm: JUNCTIONAL BRADYCARDIA  QRS Axis: normal  Intervals: normal  ST/T Wave abnormalities: indeterminate  Conduction Disutrbances:none  Narrative Interpretation:   Old EKG Reviewed: changes noted  DDx includes: Orthostatic hypotension Stroke Vertebral artery dissection/stenosis Dysrhythmia PE Vasovagal/neurocardiogenic syncope Aortic stenosis Valvular disorder/Cardiomyopathy Anemia AAA Sepsis syndrome Pneumonia/UTI Dehydration  Pt comes in  with cc of near syncope. Arrives with hypotension with MAP in the 40s, bradycardic in the 40s and with hypoxia. She is aox3, slightly cold extremity, cardiac auscultation not very helpful, faint but palpable radial pulses. No distended neck veins and cardiac echo reveals no RV collapse at diastole, mild pericardial effusion. I couldn't assess the aorta.  My initial concerns are AAA, Symptomatic bradycardia, PE, Sepsis syndrome.  Appropriate labs ordered.  Atropine given 0.5 mg x 1 - and there was some response with the HR bumping up to 60. IVB x 1 - pressure up to 90 SBP. CXR shows possible left lower lobe pneumonia and patient has LLQ pain with hx of diverticular disease. Antibiotics started, lactate ordered.  Pt is also on verapamil, unknown dose - and so this could be medication induced.  Both critical care and Cardiology has been consulted, and the disposition is patient to step down icu with dopamine if needed.  Transcutaneous pacer placed. Will get central line if needed - pacing/pressors.  Family informed     Derwood Kaplan, MD 02/10/12 1904  Lactate returned at > 6. Pt is septic shock vs. Cardiogenic shock vs. Obstructive shock (from PE)  right now. Dr. Sharyn Lull accepting physician. We discussed the pressor, and given bradycardia, dopamine is a good choice. Will initiate the dopamine drip. We also discussed lines. Pt has 3 peripheral iv access. I asked if Dr. Randol Kern wants a central line, and he feels it's ok to transfer without them at this time. Since the definite care is at [facility], i agree with this assessment as well. Pt to ICU.  CRITICAL CARE Performed by: Derwood Kaplan   Total critical care time: 75 minutes  Critical care time was exclusive of separately billable procedures and treating other patients.  Critical care was necessary to treat or prevent imminent or life-threatening deterioration.  Critical care was time spent personally by me on the  following activities: development of treatment plan with patient and/or surrogate as well as nursing, discussions with consultants, evaluation of patient's response to treatment, examination of patient, obtaining history from patient or surrogate, ordering and performing treatments and interventions, ordering and review of laboratory studies, ordering and review of radiographic studies, pulse oximetry and re-evaluation of patient's condition.  Derwood Kaplan, MD 02/10/12 1954

## 2012-02-10 NOTE — ED Notes (Signed)
Pt states she feels weak and dizzy

## 2012-02-10 NOTE — Progress Notes (Addendum)
ANTICOAGULATION CONSULT NOTE - Initial Consult  Pharmacy Consult for Heparin Indication: pulmonary embolus  Allergies  Allergen Reactions  . Codeine     Patient Measurements: Height: 5\' 3"  (160 cm) Weight: 135 lb (61.236 kg) IBW/kg (Calculated) : 52.4  Heparin Dosing Weight: 61.2 kg  Vital Signs: BP: 91/48 mmHg (09/18 1836) Pulse Rate: 52  (09/18 1854)  Labs:  Basename 02/10/12 1758 02/10/12 1757  HGB -- 10.7*  HCT -- 30.8*  PLT -- 297  APTT 29 --  LABPROT 14.1 --  INR 1.10 --  HEPARINUNFRC -- --  CREATININE -- 1.65*  CKTOTAL -- --  CKMB -- --  TROPONINI -- <0.30    Estimated Creatinine Clearance: 18 ml/min (by C-G formula based on Cr of 1.65).   Medical History: Past Medical History  Diagnosis Date  . Heart size increased   . Diverticulitis   . Cancer     Medications:  Scheduled:    . sodium chloride   Intravenous Once  . atropine  0.5 mg Intravenous Once  . azithromycin  500 mg Intravenous Q24H  . heparin  3,000 Units Intravenous Once  . ondansetron  4 mg Intravenous Once  . sodium chloride  1,000 mL Intravenous Once    Assessment: Possible PE No home medication record at this time Labs acceptable  Goal of Therapy:  Heparin level 0.3-0.7 units/ml Monitor platelets by anticoagulation protocol: Yes   Plan:  Heparin 3000 Unit IV bolus, then 900 units/hr Heparin level at in 8 hours, then daily Monitor platelets and H&H Labs per protocol  Erica Austin, Erica Austin 02/10/2012,7:11 PM

## 2012-02-10 NOTE — ED Notes (Signed)
Pharmacy paged

## 2012-02-10 NOTE — H&P (Signed)
Erica Austin is an 78 y.o. female.   Chief Complaint: Generalized weakness/near-syncope HPI:  Patient is 78 year old female with past medical history significant for hypertension, history of CVA with right eye visual loss, history of diverticulitis, was transferred from [facility]. Patient complained of generalized weakness associated with near syncopal episode while at her niece's house. She denies any chest pain nausea vomiting diaphoresis. Denies any palpitation lightheaded or syncopal episode. Patient complains also of left lower abdominal pain denies any diarrhea or vomiting. In ER patient was noted to be hypotensive bradycardic with junctional rhythm received 1 mg of IV atropine and fluid challenge with increase of blood pressure in 90s and subsequently was started on IV dopamine. EKG done in the ER showed junctional rhythm probably old inferior wall myocardial infarction. Patient was also noted to have elevated white count and lactic acid chest x-ray showed probable left lower lobe infiltrate and mild CHF.  Past Medical History  Diagnosis Date  . Heart size increased   . Diverticulitis   . Cancer     Past Surgical History  Procedure Date  . Abdominal hysterectomy   . Cholecystectomy   . Mastectomy     No family history on file. Social History:  reports that she has never smoked. She does not have any smokeless tobacco history on file. She reports that she does not drink alcohol or use illicit drugs.  Allergies:  Allergies  Allergen Reactions  . Codeine     Medications Prior to Admission  Medication Sig Dispense Refill  . levothyroxine (SYNTHROID, LEVOTHROID) 175 MCG tablet Take 180 mcg by mouth daily.      Marland Kitchen losartan (COZAAR) 50 MG tablet Take 50 mg by mouth daily.      . verapamil (CALAN) 40 MG tablet Take 50 mg by mouth 3 (three) times daily.        Results for orders placed during the hospital encounter of 02/10/12 (from the past 48 hour(s))  CBC WITH  DIFFERENTIAL     Status: Abnormal   Collection Time   02/10/12  5:57 PM      Component Value Range Comment   WBC 14.5 (*) 4.0 - 10.5 K/uL    RBC 2.86 (*) 3.87 - 5.11 MIL/uL    Hemoglobin 10.7 (*) 12.0 - 15.0 g/dL    HCT 46.9 (*) 62.9 - 46.0 %    MCV 107.7 (*) 78.0 - 100.0 fL    MCH 37.4 (*) 26.0 - 34.0 pg    MCHC 34.7  30.0 - 36.0 g/dL    RDW 52.8  41.3 - 24.4 %    Platelets 297  150 - 400 K/uL    Neutrophils Relative 82 (*) 43 - 77 %    Neutro Abs 11.9 (*) 1.7 - 7.7 K/uL    Lymphocytes Relative 11 (*) 12 - 46 %    Lymphs Abs 1.5  0.7 - 4.0 K/uL    Monocytes Relative 6  3 - 12 %    Monocytes Absolute 0.9  0.1 - 1.0 K/uL    Eosinophils Relative 1  0 - 5 %    Eosinophils Absolute 0.1  0.0 - 0.7 K/uL    Basophils Relative 0  0 - 1 %    Basophils Absolute 0.0  0.0 - 0.1 K/uL   BASIC METABOLIC PANEL     Status: Abnormal   Collection Time   02/10/12  5:57 PM      Component Value Range Comment   Sodium 138  135 - 145 mEq/L    Potassium 3.7  3.5 - 5.1 mEq/L    Chloride 100  96 - 112 mEq/L    CO2 21  19 - 32 mEq/L    Glucose, Bld 214 (*) 70 - 99 mg/dL    BUN 27 (*) 6 - 23 mg/dL    Creatinine, Ser 7.82 (*) 0.50 - 1.10 mg/dL    Calcium 9.4  8.4 - 95.6 mg/dL    GFR calc non Af Amer 26 (*) >90 mL/min    GFR calc Af Amer 30 (*) >90 mL/min   TROPONIN I     Status: Normal   Collection Time   02/10/12  5:57 PM      Component Value Range Comment   Troponin I <0.30  <0.30 ng/mL   MAGNESIUM     Status: Normal   Collection Time   02/10/12  5:57 PM      Component Value Range Comment   Magnesium 2.3  1.5 - 2.5 mg/dL   PHOSPHORUS     Status: Normal   Collection Time   02/10/12  5:57 PM      Component Value Range Comment   Phosphorus 3.0  2.3 - 4.6 mg/dL   APTT     Status: Normal   Collection Time   02/10/12  5:58 PM      Component Value Range Comment   aPTT 29  24 - 37 seconds   PRO B NATRIURETIC PEPTIDE     Status: Abnormal   Collection Time   02/10/12  5:58 PM      Component Value  Range Comment   Pro B Natriuretic peptide (BNP) 516.0 (*) 0 - 450 pg/mL   PROTIME-INR     Status: Normal   Collection Time   02/10/12  5:58 PM      Component Value Range Comment   Prothrombin Time 14.1  11.6 - 15.2 seconds    INR 1.10  0.00 - 1.49   URINALYSIS, ROUTINE W REFLEX MICROSCOPIC     Status: Normal   Collection Time   02/10/12  6:01 PM      Component Value Range Comment   Color, Urine YELLOW  YELLOW    APPearance CLEAR  CLEAR    Specific Gravity, Urine 1.025  1.005 - 1.030    pH 5.5  5.0 - 8.0    Glucose, UA NEGATIVE  NEGATIVE mg/dL    Hgb urine dipstick NEGATIVE  NEGATIVE    Bilirubin Urine NEGATIVE  NEGATIVE    Ketones, ur NEGATIVE  NEGATIVE mg/dL    Protein, ur NEGATIVE  NEGATIVE mg/dL    Urobilinogen, UA 0.2  0.0 - 1.0 mg/dL    Nitrite NEGATIVE  NEGATIVE    Leukocytes, UA NEGATIVE  NEGATIVE MICROSCOPIC NOT DONE ON URINES WITH NEGATIVE PROTEIN, BLOOD, LEUKOCYTES, NITRITE, OR GLUCOSE <1000 mg/dL.  BLOOD GAS, ARTERIAL     Status: Abnormal   Collection Time   02/10/12  6:01 PM      Component Value Range Comment   O2 Content 6.0      Delivery systems NASAL CANNULA      pH, Arterial 7.311 (*) 7.350 - 7.450    pCO2 arterial 34.8 (*) 35.0 - 45.0 mmHg    pO2, Arterial 57.4 (*) 80.0 - 100.0 mmHg    Bicarbonate 17.1 (*) 20.0 - 24.0 mEq/L    TCO2 16.2  0 - 100 mmol/L    Acid-base deficit 8.0 (*) 0.0 - 2.0 mmol/L  O2 Saturation 86.2      Patient temperature 37.0      Collection site RIGHT BRACHIAL      Drawn by [phone]      Sample type ARTERIAL DRAW      Allens test (pass/fail) PASS  PASS   LACTIC ACID, PLASMA     Status: Abnormal   Collection Time   02/10/12  6:49 PM      Component Value Range Comment   Lactic Acid, Venous 6.3 (*) 0.5 - 2.2 mmol/L    Dg Chest Port 1 View  02/10/2012  *RADIOLOGY REPORT*  Clinical Data: Nausea, near-syncope history breast cancer post mastectomy  PORTABLE CHEST - 1 VIEW  Comparison: Portable exam 1730 hours compared to 02/14/2011the   Findings: Normal heart size, mediastinal contours and pulmonary vascularity for technique. Calcified mildly tortuous thoracic aorta. Question retrocardiac left lower lobe infiltrate. Minimal atelectasis left base. Bronchitic changes. No pleural effusion or pneumothorax.  IMPRESSION: Questionable left lower lobe infiltrate.   Original Report Authenticated By: Lollie Marrow, M.D.     Review of Systems  Constitutional: Positive for malaise/fatigue. Negative for fever and chills.  Respiratory: Negative for cough, hemoptysis and sputum production.   Cardiovascular: Negative for chest pain, palpitations and orthopnea.  Gastrointestinal: Positive for abdominal pain. Negative for nausea, vomiting and diarrhea.  Genitourinary: Negative for dysuria and urgency.  Musculoskeletal: Positive for myalgias.  Neurological: Positive for weakness. Negative for dizziness and headaches.    Blood pressure 89/35, pulse 56, resp. rate 23, height 5\' 3"  (1.6 m), weight 61.236 kg (135 lb), SpO2 97.00%. Physical Exam  Constitutional: She is oriented to person, place, and time.  HENT:  Head: Normocephalic and atraumatic.  Eyes: Conjunctivae normal are normal. Left eye exhibits no discharge. No scleral icterus.  Neck: Neck supple. No JVD present. No tracheal deviation present. No thyromegaly present.  Cardiovascular:       Regular rate S1-S2 soft soft systolic murmur and S3 gallop noted  Respiratory:       Bilateral rhonchi and crackles noted  GI: Soft. Bowel sounds are normal. She exhibits distension. There is tenderness (Mild left lower quadrant tenderness noted). There is no rebound and no guarding.  Musculoskeletal: She exhibits no edema and no tenderness.  Neurological: She is alert and oriented to person, place, and time.     Assessment/Plan Hypotensive shock probably secondary to sepsis/medications Coronary artery disease history of probable silent MI in the past  Probable acute diverticulitis Probable  bilateral pneumonia History of CVA Chronic kidney disease History of CA of bilateral breast  symptomatic bradycardia probably secondary to medication rule out sick sinus syndrome/ Plan As per orders Prognosis guarded discuss with family full code at present  [facility] N 02/10/2012, 9:36 PM

## 2012-02-10 NOTE — ED Notes (Signed)
Lab confirmed that both [facility] were done prior to start of IV antibiotics

## 2012-02-11 LAB — COMPREHENSIVE METABOLIC PANEL
Albumin: 3.3 g/dL — ABNORMAL LOW (ref 3.5–5.2)
BUN: 27 mg/dL — ABNORMAL HIGH (ref 6–23)
Calcium: 8.6 mg/dL (ref 8.4–10.5)
Creatinine, Ser: 1.48 mg/dL — ABNORMAL HIGH (ref 0.50–1.10)
GFR calc Af Amer: 34 mL/min — ABNORMAL LOW (ref >90)
Potassium: 4.4 mEq/L (ref 3.5–5.1)
Total Protein: 6.4 g/dL (ref 6.0–8.3)

## 2012-02-11 LAB — BASIC METABOLIC PANEL
Calcium: 9 mg/dL (ref 8.4–10.5)
Creatinine, Ser: 1.62 mg/dL — ABNORMAL HIGH (ref 0.50–1.10)
GFR calc Af Amer: 31 mL/min — ABNORMAL LOW (ref >90)

## 2012-02-11 LAB — LEGIONELLA ANTIGEN, URINE

## 2012-02-11 LAB — AMYLASE: Amylase: 89 U/L (ref 0–105)

## 2012-02-11 LAB — URINE CULTURE

## 2012-02-11 LAB — PROCALCITONIN: Procalcitonin: <0.1 ng/mL

## 2012-02-11 LAB — MAGNESIUM: Magnesium: 1.8 mg/dL (ref 1.5–2.5)

## 2012-02-11 LAB — CBC
MCH: 38 pg — ABNORMAL HIGH (ref 26.0–34.0)
MCV: 108 fL — ABNORMAL HIGH (ref 78.0–100.0)
Platelets: 246 10*3/uL (ref 150–400)
RDW: 12.9 % (ref 11.5–15.5)

## 2012-02-11 LAB — GLUCOSE, CAPILLARY
Glucose-Capillary: 271 mg/dL — ABNORMAL HIGH (ref 70–99)
Glucose-Capillary: 97 mg/dL (ref 70–99)
Glucose-Capillary: 110 mg/dL — ABNORMAL HIGH (ref 70–99)
Glucose-Capillary: 138 mg/dL — ABNORMAL HIGH (ref 70–99)

## 2012-02-11 LAB — HEPARIN LEVEL (UNFRACTIONATED): Heparin Unfractionated: 0.6 IU/mL (ref 0.30–0.70)

## 2012-02-11 LAB — TROPONIN I: Troponin I: <0.3 ng/mL (ref <0.30)

## 2012-02-11 LAB — PHOSPHORUS: Phosphorus: 4.1 mg/dL (ref 2.3–4.6)

## 2012-02-11 LAB — MRSA PCR SCREENING: MRSA by PCR: POSITIVE — AB

## 2012-02-11 LAB — LIPASE, BLOOD: Lipase: 39 U/L (ref 11–59)

## 2012-02-11 MED ORDER — INSULIN ASPART 100 UNIT/ML ~~LOC~~ SOLN
5.0000 [IU] | Freq: Once | SUBCUTANEOUS | Status: AC
  Administered 2012-02-11: 5 [IU] via SUBCUTANEOUS

## 2012-02-11 MED ORDER — HEPARIN SODIUM (PORCINE) 5000 UNIT/ML IJ SOLN
5000.0000 [IU] | Freq: Three times a day (TID) | INTRAMUSCULAR | Status: DC
  Administered 2012-02-11 – 2012-02-16 (×15): 5000 [IU] via SUBCUTANEOUS
  Filled 2012-02-11 (×20): qty 1

## 2012-02-11 MED ORDER — CHLORHEXIDINE GLUCONATE CLOTH 2 % EX PADS: 6.0000 | MEDICATED_PAD | Freq: Every day | CUTANEOUS | Status: DC

## 2012-02-11 MED ORDER — DOPAMINE-DEXTROSE 3.2-5 MG/ML-% IV SOLN: 2.0000 ug/kg/min | Freq: Once | INTRAVENOUS | Status: DC

## 2012-02-11 MED ORDER — INSULIN ASPART 100 UNIT/ML ~~LOC~~ SOLN
0.0000 [IU] | Freq: Three times a day (TID) | SUBCUTANEOUS | Status: DC
  Administered 2012-02-12 – 2012-02-14 (×3): 1 [IU] via SUBCUTANEOUS

## 2012-02-11 MED ORDER — DOPAMINE-DEXTROSE 3.2-5 MG/ML-% IV SOLN: 2.0000 ug/kg/min | INTRAVENOUS | Status: DC

## 2012-02-11 MED ORDER — SODIUM CHLORIDE 0.9 % IV BOLUS (SEPSIS): 500.0000 mL | Freq: Once | INTRAVENOUS | Status: DC

## 2012-02-11 MED ORDER — MUPIROCIN 2 % EX OINT
1.0000 "application " | TOPICAL_OINTMENT | Freq: Two times a day (BID) | CUTANEOUS | Status: AC
  Administered 2012-02-11 – 2012-02-16 (×10): 1 via NASAL
  Filled 2012-02-11 (×2): qty 22

## 2012-02-11 NOTE — Progress Notes (Signed)
  Echocardiogram 2D Echocardiogram has been performed.  Erica Austin 02/11/2012, 10:08 AM

## 2012-02-11 NOTE — Progress Notes (Signed)
Subjective:  Patient states feels much better today. Denies any chest pain or shortness of breath. Abdominal pain improved. Urine output improved patient is off dopamine  Objective:  Vital Signs in the last 24 hours: Temp:  [97 F (36.1 C)-97.8 F (36.6 C)] 97.8 F (36.6 C) (09/19 0808) Pulse Rate:  [40-87] 78  (09/19 0700) Resp:  [15-25] 15  (09/19 0700) BP: (64-140)/(31-60) 124/60 mmHg (09/19 0700) SpO2:  [84 %-100 %] 100 % (09/19 0700) FiO2 (%):  [100 %] 100 % (09/18 2200) Weight:  [61.236 kg (135 lb)-62.596 kg (138 lb)] 61.236 kg (135 lb) (09/18 1715)  Intake/Output from previous day: 09/18 0701 - 09/19 0700 In: 2558.7 [I.V.:1840.7; IV Piggyback:718] Out: 405 [Urine:405] Intake/Output from this shift:    Physical Exam: Neck: no adenopathy, no carotid bruit, no JVD and supple, symmetrical, trachea midline Lungs: Bilateral rhonchi and basilar Rales noted Heart: regular rate and rhythm, S1, S2 normal and Soft systolic murmur and S3 gallop noted Abdomen: soft, non-tender; bowel sounds normal; no masses,  no organomegaly Extremities: extremities normal, atraumatic, no cyanosis or edema  Lab Results:  Basename 02/11/12 0435 02/10/12 2325  WBC 20.1* 18.4*  HGB 10.9* 10.7*  PLT 246 293    Basename 02/11/12 0435 02/10/12 2325  NA 138 137  K 4.0 4.4  CL 103 102  CO2 16* 9*  GLUCOSE 229* 334*  BUN 27* 27*  CREATININE 1.62* 1.48*    Basename 02/11/12 0420 02/10/12 2325  TROPONINI <0.30 <0.30   Hepatic Function Panel  Basename 02/10/12 2325  PROT 6.4  ALBUMIN 3.3*  AST 110*  ALT 91*  ALKPHOS 61  BILITOT 0.6  BILIDIR --  IBILI --   No results found for this basename: CHOL in the last 72 hours No results found for this basename: PROTIME in the last 72 hours  Imaging: Imaging results have been reviewed and US Aorta  02/10/2012  *RADIOLOGY REPORT*  Clinical Data:  Near syncope.  ULTRASOUND OF ABDOMINAL AORTA  Technique:  Ultrasound examination of the abdominal  aorta was performed to evaluate for abdominal aortic aneurysm.  Comparison: 01/30/2005 CT.  Abdominal Aorta:  No aneurysm identified.        Maximum AP diameter:  1.9 cm.       Maximum TRV diameter:  2.1 cm.  Atherosclerotic type changes of the abdominal aorta noted.  Aortic bifurcation and common iliac arteries not well delineated secondary to bowel gas.  IMPRESSION: No abdominal aortic aneurysm identified.  Atherosclerotic type changes of the abdominal aorta noted.  Aortic bifurcation and common iliac arteries not well delineated secondary to bowel gas.   Original Report Authenticated By: Fuller Canada, M.D.    Dg Chest Port 1 View  02/10/2012  *RADIOLOGY REPORT*  Clinical Data: Nausea, near-syncope history breast cancer post mastectomy  PORTABLE CHEST - 1 VIEW  Comparison: Portable exam 1730 hours compared to 02/14/2011the  Findings: Normal heart size, mediastinal contours and pulmonary vascularity for technique. Calcified mildly tortuous thoracic aorta. Question retrocardiac left lower lobe infiltrate. Minimal atelectasis left base. Bronchitic changes. No pleural effusion or pneumothorax.  IMPRESSION: Questionable left lower lobe infiltrate.   Original Report Authenticated By: Lollie Marrow, M.D.     Cardiac Studies:  Assessment/Plan:  Status post Hypotensive shock probably secondary to sepsis/medications  Coronary artery disease history of probable silent MI in the past  Probable acute diverticulitis  Probable bilateral pneumonia  History of CVA  Chronic kidney disease  History of CA of bilateral breast  Status post symptomatic bradycardia probably secondary to medication rule out sick sinus syndrome/  Plan Continue present management Check cultures Out of bed to chair OT PT consult DC IV heparin change to subcutaneous for DVT prophylaxis  LOS: 1 day    Erica Austin 02/11/2012, 8:15 AM

## 2012-02-11 NOTE — Progress Notes (Signed)
ANTICOAGULATION CONSULT NOTE - Follow Up Consult  Pharmacy Consult for heparin Indication: pulmonary embolus  Allergies  Allergen Reactions  . Codeine Nausea And Vomiting    Patient Measurements: Height: 5\' 3"  (160 cm) Weight: 135 lb (61.236 kg) IBW/kg (Calculated) : 52.4   Vital Signs: Temp: 97.5 F (36.4 C) (09/19 0400) Temp src: Oral (09/19 0400) BP: 124/60 mmHg (09/19 0700) Pulse Rate: 78  (09/19 0700)  Labs:  Basename 02/11/12 0435 02/11/12 0420 02/11/12 0410 02/10/12 2325 02/10/12 1758 02/10/12 1757  HGB 10.9* -- -- 10.7* -- --  HCT 31.0* -- -- 30.1* -- 30.8*  PLT 246 -- -- 293 -- 297  APTT -- -- -- 127* 29 --  LABPROT -- -- -- 16.5* 14.1 --  INR -- -- -- 1.37 1.10 --  HEPARINUNFRC -- -- 0.60 -- -- --  CREATININE 1.62* -- -- 1.48* -- 1.65*  CKTOTAL -- -- -- -- -- --  CKMB -- -- -- -- -- --  TROPONINI -- <0.30 -- <0.30 -- <0.30    Estimated Creatinine Clearance: 18.3 ml/min (by C-G formula based on Cr of 1.62).   Medications:  Prescriptions prior to admission  Medication Sig Dispense Refill  . levothyroxine (SYNTHROID, LEVOTHROID) 175 MCG tablet Take 180 mcg by mouth daily.      Marland Kitchen losartan (COZAAR) 50 MG tablet Take 50 mg by mouth daily.      . verapamil (CALAN) 40 MG tablet Take 50 mg by mouth 3 (three) times daily.       Scheduled:    . sodium chloride   Intravenous Once  . aspirin  324 mg Oral NOW   Or  . aspirin  300 mg Rectal NOW  . atropine  0.5 mg Intravenous Once  . azithromycin  500 mg Intravenous Q24H  . azithromycin  500 mg Intravenous Q24H  . cefTRIAXone (ROCEPHIN)  IV  1 g Intravenous Once  . cefTRIAXone (ROCEPHIN)  IV  1 g Intravenous Q24H  . DOPamine  2-20 mcg/kg/min Intravenous Once  . heparin  3,000 Units Intravenous Once  . insulin aspart  0-9 Units Subcutaneous TID WC  . insulin aspart  5 Units Subcutaneous Once  . metroNIDAZOLE  500 mg Intravenous Once  . metronidazole  500 mg Intravenous Q8H  . ondansetron  4 mg Intravenous  Once  . pantoprazole (PROTONIX) IV  40 mg Intravenous QHS  . sodium chloride  1,000 mL Intravenous Once  . DISCONTD: azithromycin  500 mg Intravenous Q24H  . DISCONTD: DOPamine  2-20 mcg/kg/min Intravenous Once  . DISCONTD: sodium chloride  500 mL Intravenous Once    Assessment: 78   yo female here with syncope with possible PE on heparin at 900 units/hr and initial level is at goal (HL=0.6).  Hg=10.9 and plt=246 and stable thus far.  Goal of Therapy:  Heparin level 0.3-0.7 units/ml Monitor platelets by anticoagulation protocol: Yes   Plan:  -No heparin changes needed -Repeat heparin level today to assure at goal  Harland German, Pharm D 02/11/2012 8:04 AM

## 2012-02-11 NOTE — Progress Notes (Signed)
CRITICAL VALUE ALERT  Critical value received:  C02 9   Date of notification:  02-10-12  Time of notification:  0110  Critical value read back: yes  Nurse who received alert:  Oralia Rud  MD notified (1st page):  Harwani  Time of first page: 0120  MD notified (2nd page):  Time of second page:  Responding MD:  Sharyn Lull  Time MD responded: [phone]

## 2012-02-12 ENCOUNTER — Inpatient Hospital Stay (HOSPITAL_COMMUNITY): Payer: Medicare Other

## 2012-02-12 LAB — URINE CULTURE: Culture: NO GROWTH

## 2012-02-12 LAB — CBC
MCH: 33.5 pg (ref 26.0–34.0)
MCHC: 32.7 g/dL (ref 30.0–36.0)
Platelets: 219 10*3/uL (ref 150–400)
RDW: 12.5 % (ref 11.5–15.5)

## 2012-02-12 LAB — BASIC METABOLIC PANEL
Calcium: 8.9 mg/dL (ref 8.4–10.5)
GFR calc Af Amer: 44 mL/min — ABNORMAL LOW (ref >90)
GFR calc non Af Amer: 38 mL/min — ABNORMAL LOW (ref >90)
Potassium: 4 mEq/L (ref 3.5–5.1)
Sodium: 140 mEq/L (ref 135–145)

## 2012-02-12 MED ORDER — AMLODIPINE BESYLATE 5 MG PO TABS
5.0000 mg | ORAL_TABLET | Freq: Every day | ORAL | Status: DC
  Administered 2012-02-12 – 2012-02-15 (×4): 5 mg via ORAL
  Filled 2012-02-12 (×4): qty 1

## 2012-02-12 MED ORDER — CHLORHEXIDINE GLUCONATE CLOTH 2 % EX PADS
6.0000 | MEDICATED_PAD | Freq: Every day | CUTANEOUS | Status: DC
  Administered 2012-02-12 – 2012-02-17 (×5): 6 via TOPICAL

## 2012-02-12 NOTE — Evaluation (Signed)
Physical Therapy Evaluation Patient Details Name: Erica Austin MRN: [id number] DOB: 17-Nov-1917 Today's Date: 02/12/2012 Time: 7829-5621 PT Time Calculation (min): 36 min  PT Assessment / Plan / Recommendation Clinical Impression  Patient s/p sepsis, bil PNA, and sepsis.  Will benefit from PT for high level balance activities.  Needs to get Outpt. PT f/u for balance program and needs to use RW at all times on d/c for safety.      PT Assessment  Patient needs continued PT services    Follow Up Recommendations  Outpatient PT;Supervision/Assistance - 24 hour    Barriers to Discharge        Equipment Recommendations  None recommended by PT    Recommendations for Other Services     Frequency Min 3X/week    Precautions / Restrictions Precautions Precautions: Fall Restrictions Weight Bearing Restrictions: No   Pertinent Vitals/Pain VSS, No pain      Mobility  Bed Mobility Bed Mobility: Not assessed Transfers Transfers: Sit to Stand;Stand to Sit Sit to Stand: 7: Independent;With upper extremity assist;With armrests;From chair/3-in-1 Stand to Sit: 7: Independent;With upper extremity assist;With armrests;To chair/3-in-1 Ambulation/Gait Ambulation/Gait Assistance: 4: Min guard Ambulation Distance (Feet): 150 Feet Assistive device: None Ambulation/Gait Assistance Details: Patient lists to left but did not lose balance without any challenges.  Patient able to self correct with min challenges to balance but needs steadying assist with mod challenges to balance.  Patient needed cues to slow down.   Gait Pattern: Step-through pattern;Decreased stride length Gait velocity: increased, needs cues to slow down. Stairs: No Wheelchair Mobility Wheelchair Mobility: No         PT Diagnosis: Generalized weakness  PT Problem List: Decreased activity tolerance;Decreased balance;Decreased mobility;Decreased safety awareness;Decreased knowledge of use of DME PT Treatment Interventions:  Balance training;Therapeutic exercise;Therapeutic activities;Functional mobility training;Gait training;DME instruction;Patient/family education;Stair training   PT Goals Acute Rehab PT Goals PT Goal Formulation: With patient Time For Goal Achievement: 02/19/12 Potential to Achieve Goals: Good Pt will Ambulate: >150 feet;with modified independence;with least restrictive assistive device PT Goal: Ambulate - Progress: Goal set today Pt will Go Up / Down Stairs: Flight;with supervision;with least restrictive assistive device PT Goal: Up/Down Stairs - Progress: Goal set today Additional Goals Additional Goal #1: Patient to score > 52/56 on Berg.  PT Goal: Additional Goal #1 - Progress: Goal set today  Visit Information  Last PT Received On: 02/12/12 Assistance Needed: +1    Subjective Data  Subjective: "I probably run around too much." Patient Stated Goal: To go home    Prior Functioning  Home Living Lives With: Spouse Available Help at Discharge: Available 24 hours/day Type of Home: House Home Access: Ramped entrance Home Layout: Two level;Laundry or work area in Artist of Steps: 15 Alternate Level Stairs-Rails: Can reach both Bathroom Shower/Tub: Associate Professor: Yes How Accessible: Accessible via walker Home Adaptive Equipment: Bedside commode/3-in-1;Shower chair with back;Walker - rolling;Wheelchair - manual Additional Comments: PAtient states that she still hangs her clothes on a clothesline.  Prior Function Level of Independence: Independent Able to Take Stairs?: Reciprically Driving: Yes Vocation: Retired Musician: No difficulties Dominant Hand: Right    Cognition  Overall Cognitive Status: Appears within functional limits for tasks assessed/performed Arousal/Alertness: Awake/alert Orientation Level: Appears intact for tasks assessed Behavior During Session: [facility]  for tasks performed    Extremity/Trunk Assessment Right Lower Extremity Assessment RLE ROM/Strength/Tone: [facility] for tasks assessed Left Lower Extremity Assessment LLE ROM/Strength/Tone: [facility] for tasks assessed Trunk  Assessment Trunk Assessment: Normal   Balance Standardized Balance Assessment Standardized Balance Assessment: Berg Balance Test Berg Balance Test Sit to Stand: Able to stand without using hands and stabilize independently Standing Unsupported: Able to stand safely 2 minutes Sitting with Back Unsupported but Feet Supported on Floor or Stool: Able to sit safely and securely 2 minutes Stand to Sit: Sits safely with minimal use of hands Transfers: Able to transfer safely, minor use of hands Standing Unsupported with Eyes Closed: Able to stand 10 seconds safely Standing Ubsupported with Feet Together: Able to place feet together independently and stand 1 minute safely From Standing, Reach Forward with Outstretched Arm: Can reach confidently >25 cm (10") From Standing Position, Pick up Object from Floor: Able to pick up shoe safely and easily From Standing Position, Turn to Look Behind Over each Shoulder: Looks behind from both sides and weight shifts well Turn 360 Degrees: Able to turn 360 degrees safely in 4 seconds or less Standing Unsupported, Alternately Place Feet on Step/Stool: Able to stand independently and safely and complete 8 steps in 20 seconds Standing Unsupported, One Foot in Front: Loses balance while stepping or standing Standing on One Leg: Unable to try or needs assist to prevent fall Total Score: 48  High Level Balance High Level Balance Comments: 48/56 on Berg suggesting that patient is at >50 % risk of falls if not using a device.  Educated patient that she needs to use RW at all times on d/c.  End of Session PT - End of Session Equipment Utilized During Treatment: Gait belt Activity Tolerance: Patient tolerated treatment well Patient left: in chair;with call  bell/phone within reach;with family/visitor present Nurse Communication: Mobility status       INGOLD,Sahil Milner 02/12/2012, 1:23 PM  [facility] Acute Rehabilitation [phone] (pager)

## 2012-02-12 NOTE — Evaluation (Signed)
Occupational Therapy Evaluation Patient Details Name: Erica Austin MRN: [id number] DOB: May 14, 1918 Today's Date: 02/12/2012 Time: 5621-3086 OT Time Calculation (min): 31 min  OT Assessment / Plan / Recommendation Clinical Impression  Patient s/p sepsis, bil PNA, and sepsis and presents with generalized weakness/debility and minor balance deficits. Pt will benefit from skilled OT in the acute setting to maximize I with ADL and ADL mobility prior to d/c.    OT Assessment  Patient needs continued OT Services    Follow Up Recommendations  No OT follow up;Supervision/Assistance - 24 hour    Barriers to Discharge      Equipment Recommendations  None recommended by PT;None recommended by OT    Recommendations for Other Services    Frequency  Min 2X/week    Precautions / Restrictions Precautions Precautions: Fall Restrictions Weight Bearing Restrictions: No   Pertinent Vitals/Pain Pt reports intermittent abdominal pain which she attributes to her diverticulitis.     ADL  Eating/Feeding: Independent;Simulated Where Assessed - Eating/Feeding: Chair Grooming: Performed;Wash/dry hands;Supervision/safety Where Assessed - Grooming: Unsupported standing Upper Body Bathing: Simulated;Supervision/safety Where Assessed - Upper Body Bathing: Supported sit to stand Lower Body Bathing: Simulated;Supervision/safety Where Assessed - Lower Body Bathing: Supported sit to stand Upper Body Dressing: Simulated;Independent Where Assessed - Upper Body Dressing: Unsupported sitting Lower Body Dressing: Performed;Supervision/safety Where Assessed - Lower Body Dressing: Unsupported sit to stand Toilet Transfer: Performed;Supervision/safety Toilet Transfer Method: Sit to Barista: Regular height toilet;Grab bars Toileting - Clothing Manipulation and Hygiene: Performed;Supervision/safety Where Assessed - Engineer, mining and Hygiene: Sit to stand from 3-in-1 or  toilet Equipment Used: Gait belt Transfers/Ambulation Related to ADLs: Pt Min guard A with ambulation to/from bathroom. Pt occasionally furniture walking, but able to let go and ambulate with guarding by OT ADL Comments: Pt at times moving faster than currently safe, especially with turns    OT Diagnosis: Generalized weakness  OT Problem List: Decreased activity tolerance;Impaired balance (sitting and/or standing);Decreased knowledge of use of DME or AE OT Treatment Interventions: Self-care/ADL training;DME and/or AE instruction;Therapeutic activities;Balance training;Patient/family education   OT Goals Acute Rehab OT Goals OT Goal Formulation: With patient Time For Goal Achievement: 02/19/12 Potential to Achieve Goals: Good ADL Goals Pt Will Perform Grooming: Independently;Standing at sink ADL Goal: Grooming - Progress: Goal set today Pt Will Perform Lower Body Dressing: Independently;Sit to stand from bed;Sit to stand from chair ADL Goal: Lower Body Dressing - Progress: Goal set today Pt Will Transfer to Toilet: Independently;Ambulation ADL Goal: Toilet Transfer - Progress: Goal set today Pt Will Perform Toileting - Clothing Manipulation: Independently;Standing ADL Goal: Toileting - Clothing Manipulation - Progress: Goal set today Pt Will Perform Toileting - Hygiene: Independently;Sit to stand from 3-in-1/toilet ADL Goal: Toileting - Hygiene - Progress: Goal set today Pt Will Perform Tub/Shower Transfer: Tub transfer;Ambulation;with DME;Independently ADL Goal: Tub/Shower Transfer - Progress: Goal set today  Visit Information  Last OT Received On: 02/12/12 Assistance Needed: +1    Subjective Data  Subjective: I'm really feeling much better. I thought I was dying the other day. Patient Stated Goal: Return home with husband   Prior Functioning  Vision/Perception  Home Living Lives With: Spouse Available Help at Discharge: Available 24 hours/day Type of Home: House Home  Access: Ramped entrance Home Layout: Two level;Laundry or work area in Artist of Steps: 15 Alternate Level Stairs-Rails: Can reach both Bathroom Shower/Tub: Associate Professor: Yes How Accessible: Accessible via walker Home Adaptive Equipment: Bedside commode/3-in-1;Shower  chair with back;Walker - rolling;Wheelchair - manual Additional Comments: PAtient states that she still hangs her clothes on a clothesline.  Prior Function Level of Independence: Independent Able to Take Stairs?: Reciprically Driving: Yes Vocation: Retired Musician: No difficulties Dominant Hand: Right      Cognition  Overall Cognitive Status: Appears within functional limits for tasks assessed/performed Arousal/Alertness: Awake/alert Orientation Level: Appears intact for tasks assessed Behavior During Session: [facility] for tasks performed    Extremity/Trunk Assessment Right Upper Extremity Assessment RUE ROM/Strength/Tone: Within functional levels RUE Sensation: WFL - Light Touch RUE Coordination: WFL - gross/fine motor Left Upper Extremity Assessment LUE ROM/Strength/Tone: Within functional levels LUE Sensation: WFL - Light Touch LUE Coordination: WFL - gross/fine motor Right Lower Extremity Assessment RLE ROM/Strength/Tone: WFL for tasks assessed Left Lower Extremity Assessment LLE ROM/Strength/Tone: WFL for tasks assessed Trunk Assessment Trunk Assessment: Normal   Mobility  Shoulder Instructions  Bed Mobility Bed Mobility: Not assessed Transfers Sit to Stand: 5: Supervision;From toilet;With upper extremity assist Stand to Sit: 5: Supervision;With upper extremity assist;To toilet (Mod I to chair with armrests) Details for Transfer Assistance: supervision from toilet for safety as pt having to do so with underwear down and with LUE holding onto toilet paper to wipe       Exercise     Balance  Standardized Balance Assessment Standardized Balance Assessment: Berg Balance Test Berg Balance Test Sit to Stand: Able to stand without using hands and stabilize independently Standing Unsupported: Able to stand safely 2 minutes Sitting with Back Unsupported but Feet Supported on Floor or Stool: Able to sit safely and securely 2 minutes Stand to Sit: Sits safely with minimal use of hands Transfers: Able to transfer safely, minor use of hands Standing Unsupported with Eyes Closed: Able to stand 10 seconds safely Standing Ubsupported with Feet Together: Able to place feet together independently and stand 1 minute safely From Standing, Reach Forward with Outstretched Arm: Can reach confidently >25 cm (10") From Standing Position, Pick up Object from Floor: Able to pick up shoe safely and easily From Standing Position, Turn to Look Behind Over each Shoulder: Looks behind from both sides and weight shifts well Turn 360 Degrees: Able to turn 360 degrees safely in 4 seconds or less Standing Unsupported, Alternately Place Feet on Step/Stool: Able to stand independently and safely and complete 8 steps in 20 seconds Standing Unsupported, One Foot in Front: Loses balance while stepping or standing Standing on One Leg: Unable to try or needs assist to prevent fall Total Score: 48  High Level Balance High Level Balance Comments: 48/56 on Berg suggesting that patient is at >50 % risk of falls if not using a device.  Educated patient that she needs to use RW at all times on d/c.   End of Session OT - End of Session Equipment Utilized During Treatment: Gait belt Activity Tolerance: Patient tolerated treatment well Patient left: in chair;with call bell/phone within reach;with family/visitor present Nurse Communication: Mobility status  GO     Jazara Swiney 02/12/2012, 2:36 PM

## 2012-02-12 NOTE — Progress Notes (Signed)
Subjective:  Patient denies any chest pain or shortness of breath states feels much better. Patient did state she gets heaviness in the chest off and on. Will discuss with patient and family regarding further evaluation and treatment for CAD  Objective:  Vital Signs in the last 24 hours: Temp:  [97.4 F (36.3 C)-98.4 F (36.9 C)] 98.3 F (36.8 C) (09/20 0800) Pulse Rate:  [64-92] 69  (09/20 0711) Resp:  [16-26] 18  (09/20 0711) BP: (112-173)/(58-77) 168/76 mmHg (09/20 0711) SpO2:  [86 %-100 %] 98 % (09/20 0711) Weight:  [64.2 kg (141 lb 8.6 oz)] 64.2 kg (141 lb 8.6 oz) (09/20 0400)  Intake/Output from previous day: 09/19 0701 - 09/20 0700 In: 1458.9 [P.O.:720; I.V.:128.9; IV Piggyback:610] Out: 1625 [Urine:1625] Intake/Output from this shift: Total I/O In: -  Out: 150 [Urine:150]  Physical Exam: Neck: no adenopathy, no carotid bruit, no JVD and supple, symmetrical, trachea midline Lungs: Decreased breath sound at bases with occasional rhonchi Heart: regular rate and rhythm, S1, S2 normal and Soft systolic murmur noted Abdomen: soft, non-tender; bowel sounds normal; no masses,  no organomegaly Extremities: extremities normal, atraumatic, no cyanosis or edema  Lab Results:  [facility] 02/12/12 0508 02/11/12 0435  WBC 14.1* 20.1*  HGB 9.3* 10.9*  PLT 219 246    Basename 02/12/12 0508 02/11/12 0435  NA 140 138  K 4.0 4.0  CL 106 103  CO2 24 16*  GLUCOSE 107* 229*  BUN 26* 27*  CREATININE 1.21* 1.62*    Basename 02/11/12 1000 02/11/12 0420  TROPONINI <0.30 <0.30   Hepatic Function Panel  Basename 02/10/12 2325  PROT 6.4  ALBUMIN 3.3*  AST 110*  ALT 91*  ALKPHOS 61  BILITOT 0.6  BILIDIR --  IBILI --   No results found for this basename: CHOL in the last 72 hours No results found for this basename: PROTIME in the last 72 hours  Imaging: Imaging results have been reviewed and Dg Chest 2 View  02/12/2012  *RADIOLOGY REPORT*  Clinical Data: Pneumonia,  shortness of breath, follow up  CHEST - 2 VIEW  Comparison: 02/09/2029  Findings: Normal heart size, mediastinal contours, and pulmonary vascularity. Atherosclerotic calcification aorta. Right basilar atelectasis. Improving aeration in left lower lobe question resolving infiltrate. Remaining lungs clear. Bones demineralized. Small bibasilar effusions.  IMPRESSION: Small bibasilar effusions and right basilar atelectasis. Improving infiltrate left lower lobe.   Original Report Authenticated By: Lollie Marrow, M.D.    US Aorta  02/10/2012  *RADIOLOGY REPORT*  Clinical Data:  Near syncope.  ULTRASOUND OF ABDOMINAL AORTA  Technique:  Ultrasound examination of the abdominal aorta was performed to evaluate for abdominal aortic aneurysm.  Comparison: 01/30/2005 CT.  Abdominal Aorta:  No aneurysm identified.        Maximum AP diameter:  1.9 cm.       Maximum TRV diameter:  2.1 cm.  Atherosclerotic type changes of the abdominal aorta noted.  Aortic bifurcation and common iliac arteries not well delineated secondary to bowel gas.  IMPRESSION: No abdominal aortic aneurysm identified.  Atherosclerotic type changes of the abdominal aorta noted.  Aortic bifurcation and common iliac arteries not well delineated secondary to bowel gas.   Original Report Authenticated By: Fuller Canada, M.D.    Dg Chest Port 1 View  02/10/2012  *RADIOLOGY REPORT*  Clinical Data: Nausea, near-syncope history breast cancer post mastectomy  PORTABLE CHEST - 1 VIEW  Comparison: Portable exam 1730 hours compared to 02/14/2011the  Findings: Normal heart size, mediastinal  contours and pulmonary vascularity for technique. Calcified mildly tortuous thoracic aorta. Question retrocardiac left lower lobe infiltrate. Minimal atelectasis left base. Bronchitic changes. No pleural effusion or pneumothorax.  IMPRESSION: Questionable left lower lobe infiltrate.   Original Report Authenticated By: Lollie Marrow, M.D.     Cardiac Studies:  Assessment/Plan:    Status post Hypotensive shock probably secondary to sepsis/medications  Coronary artery disease history of probable silent MI in the past  Hypertension Non-insulin-dependent diabetes mellitus controlled by diet Resolving acute diverticulitis  Resolving bilateral pneumonia  History of CVA  Chronic kidney disease  History of CA of bilateral breast  Status post symptomatic bradycardia probably secondary to medication rule out sick sinus syndrome/  Plan  Continue present management OT PT consult Increase ambulation Transfer to telemetry  LOS: 2 days    Erica Austin 02/12/2012, 8:49 AM

## 2012-02-13 LAB — CBC
HCT: 28.1 % — ABNORMAL LOW (ref 36.0–46.0)
Hemoglobin: 9.2 g/dL — ABNORMAL LOW (ref 12.0–15.0)
MCH: 33.8 pg (ref 26.0–34.0)
MCHC: 32.7 g/dL (ref 30.0–36.0)

## 2012-02-13 LAB — CLOSTRIDIUM DIFFICILE BY PCR: Toxigenic C. Difficile by PCR: NEGATIVE

## 2012-02-13 LAB — GLUCOSE, CAPILLARY
Glucose-Capillary: 92 mg/dL (ref 70–99)
Glucose-Capillary: 103 mg/dL — ABNORMAL HIGH (ref 70–99)

## 2012-02-13 MED ORDER — DIPHENHYDRAMINE HCL 25 MG PO CAPS
25.0000 mg | ORAL_CAPSULE | Freq: Once | ORAL | Status: AC
  Administered 2012-02-13: 25 mg via ORAL
  Filled 2012-02-13: qty 1

## 2012-02-13 MED ORDER — TRAMADOL HCL 50 MG PO TABS
50.0000 mg | ORAL_TABLET | Freq: Once | ORAL | Status: AC
  Administered 2012-02-13: 50 mg via ORAL
  Filled 2012-02-13: qty 1

## 2012-02-13 MED ORDER — FERROUS SULFATE 325 (65 FE) MG PO TABS
325.0000 mg | ORAL_TABLET | Freq: Two times a day (BID) | ORAL | Status: DC
  Administered 2012-02-13 – 2012-02-17 (×8): 325 mg via ORAL
  Filled 2012-02-13 (×10): qty 1

## 2012-02-13 MED ORDER — CLOPIDOGREL BISULFATE 75 MG PO TABS
75.0000 mg | ORAL_TABLET | Freq: Every day | ORAL | Status: DC
  Administered 2012-02-13 – 2012-02-16 (×4): 75 mg via ORAL
  Filled 2012-02-13 (×4): qty 1

## 2012-02-13 MED ORDER — ASPIRIN EC 81 MG PO TBEC
81.0000 mg | DELAYED_RELEASE_TABLET | Freq: Every day | ORAL | Status: DC
  Administered 2012-02-14 – 2012-02-17 (×3): 81 mg via ORAL
  Filled 2012-02-13 (×4): qty 1

## 2012-02-13 NOTE — Progress Notes (Signed)
Subjective:  Complains of generalized weakness and diarrhea. No chest pain  Objective:  Vital Signs in the last 24 hours: Temp:  [97.9 F (36.6 C)-99 F (37.2 C)] 97.9 F (36.6 C) (09/21 0500) Pulse Rate:  [65-72] 70  (09/21 0500) Resp:  [18] 18  (09/20 1300) BP: (156-184)/(68-88) 184/72 mmHg (09/21 1053) SpO2:  [91 %-96 %] 91 % (09/21 0500)  Intake/Output from previous day: 09/20 0701 - 09/21 0700 In: 600 [P.O.:600] Out: 151 [Urine:151] Intake/Output from this shift: Total I/O In: 240 [P.O.:240] Out: -   Physical Exam: Neck: no adenopathy, no carotid bruit, no JVD and supple, symmetrical, trachea midline Lungs: Decreased breath sound at bases with occasional bilateral rhonchi air entry improved Heart: regular rate and rhythm, S1, S2 normal and Soft systolic murmur noted Abdomen: soft, non-tender; bowel sounds normal; no masses,  no organomegaly Extremities: extremities normal, atraumatic, no cyanosis or edema  Lab Results:  [facility] 02/13/12 0625 02/12/12 0508  WBC 10.2 14.1*  HGB 9.2* 9.3*  PLT 226 219    Basename 02/12/12 0508 02/11/12 0435  NA 140 138  K 4.0 4.0  CL 106 103  CO2 24 16*  GLUCOSE 107* 229*  BUN 26* 27*  CREATININE 1.21* 1.62*    Basename 02/11/12 1000 02/11/12 0420  TROPONINI <0.30 <0.30   Hepatic Function Panel  Basename 02/10/12 2325  PROT 6.4  ALBUMIN 3.3*  AST 110*  ALT 91*  ALKPHOS 61  BILITOT 0.6  BILIDIR --  IBILI --   No results found for this basename: CHOL in the last 72 hours No results found for this basename: PROTIME in the last 72 hours  Imaging: Imaging results have been reviewed and Dg Chest 2 View  02/12/2012  *RADIOLOGY REPORT*  Clinical Data: Pneumonia, shortness of breath, follow up  CHEST - 2 VIEW  Comparison: 02/09/2029  Findings: Normal heart size, mediastinal contours, and pulmonary vascularity. Atherosclerotic calcification aorta. Right basilar atelectasis. Improving aeration in left lower lobe question  resolving infiltrate. Remaining lungs clear. Bones demineralized. Small bibasilar effusions.  IMPRESSION: Small bibasilar effusions and right basilar atelectasis. Improving infiltrate left lower lobe.   Original Report Authenticated By: Lollie Marrow, M.D.     Cardiac Studies:  Assessment/Plan:  Status post Hypotensive shock probably secondary to sepsis/medications  Coronary artery disease history of probable silent MI in the past  Hypertension  Non-insulin-dependent diabetes mellitus controlled by diet  Resolving acute diverticulitis  Resolving bilateral pneumonia  History of CVA  Chronic kidney disease  History of CA of bilateral breast  Status post symptomatic bradycardia probably secondary to medication rule out sick sinus syndrome/  Diarrhea rule out antibiotic associated colitis Anemia Plan Check stool for C. difficile toxin Add Feosol Plan    LOS: 3 days    Koby Hartfield N 02/13/2012, 11:32 AM

## 2012-02-14 LAB — GLUCOSE, CAPILLARY
Glucose-Capillary: 97 mg/dL (ref 70–99)
Glucose-Capillary: 113 mg/dL — ABNORMAL HIGH (ref 70–99)

## 2012-02-14 MED ORDER — ALUM & MAG HYDROXIDE-SIMETH 200-200-20 MG/5ML PO SUSP
30.0000 mL | Freq: Four times a day (QID) | ORAL | Status: DC | PRN
  Administered 2012-02-14: 30 mL via ORAL

## 2012-02-14 MED ORDER — NITROGLYCERIN 0.4 MG SL SUBL: 0.4000 mg | SUBLINGUAL_TABLET | SUBLINGUAL | Status: DC | PRN

## 2012-02-14 MED ORDER — NITROGLYCERIN 0.4 MG SL SUBL
SUBLINGUAL_TABLET | SUBLINGUAL | Status: AC
  Filled 2012-02-14: qty 25

## 2012-02-14 MED ORDER — ACETAMINOPHEN 325 MG PO TABS
650.0000 mg | ORAL_TABLET | Freq: Three times a day (TID) | ORAL | Status: DC | PRN
  Administered 2012-02-14 – 2012-02-15 (×3): 650 mg via ORAL
  Filled 2012-02-14 (×3): qty 2

## 2012-02-14 MED ORDER — PANTOPRAZOLE SODIUM 40 MG PO TBEC
40.0000 mg | DELAYED_RELEASE_TABLET | Freq: Every day | ORAL | Status: DC
  Administered 2012-02-14 – 2012-02-17 (×4): 40 mg via ORAL
  Filled 2012-02-14 (×4): qty 1

## 2012-02-14 NOTE — Progress Notes (Signed)
Subjective:  Patient denies any chest pain or shortness of breath complains of diarrhea C. difficile toxin is negative  Objective:  Vital Signs in the last 24 hours: Temp:  [98.6 F (37 C)-99 F (37.2 C)] 98.6 F (37 C) (09/22 0600) Pulse Rate:  [73-76] 73  (09/22 0600) Resp:  [18] 18  (09/22 0600) BP: (148-187)/(72-85) 148/79 mmHg (09/22 0600) SpO2:  [91 %-92 %] 92 % (09/22 0600) Weight:  [63.05 kg (139 lb)] 63.05 kg (139 lb) (09/22 0600)  Intake/Output from previous day: 09/21 0701 - 09/22 0700 In: 1840 [P.O.:840; IV Piggyback:1000] Out: 700 [Urine:700] Intake/Output from this shift: Total I/O In: 240 [P.O.:240] Out: 450 [Urine:450]  Physical Exam: Neck: no adenopathy, no carotid bruit, no JVD and supple, symmetrical, trachea midline Lungs: Decreased breath sound at bases with occasional rhonchi Heart: regular rate and rhythm, S1, S2 normal and Soft systolic murmur noted no S3 gallop Abdomen: soft, non-tender; bowel sounds normal; no masses,  no organomegaly Extremities: extremities normal, atraumatic, no cyanosis or edema  Lab Results:  [facility] 02/13/12 0625 02/12/12 0508  WBC 10.2 14.1*  HGB 9.2* 9.3*  PLT 226 219    Basename 02/12/12 0508  NA 140  K 4.0  CL 106  CO2 24  GLUCOSE 107*  BUN 26*  CREATININE 1.21*    Basename 02/11/12 1000  TROPONINI <0.30   Hepatic Function Panel No results found for this basename: PROT,ALBUMIN,AST,ALT,ALKPHOS,BILITOT,BILIDIR,IBILI in the last 72 hours No results found for this basename: CHOL in the last 72 hours No results found for this basename: PROTIME in the last 72 hours  Imaging: Imaging results have been reviewed and No results found.  Cardiac Studies:  Assessment/Plan:  Status post Hypotensive shock probably secondary to sepsis/medications  Coronary artery disease history of probable silent MI in the past  Hypertension  Non-insulin-dependent diabetes mellitus controlled by diet  Resolving acute  diverticulitis  Resolving bilateral pneumonia  History of CVA  Chronic kidney disease  History of CA of bilateral breast  Status post symptomatic bradycardia probably secondary to medication rule out sick sinus syndrome/  Diarrhea improved Anemia Plan Continue present management check labs in a.m.  LOS: 4 days    Jonea Bukowski N 02/14/2012, 9:15 AM

## 2012-02-15 ENCOUNTER — Encounter (HOSPITAL_COMMUNITY): Payer: Self-pay | Admitting: Pharmacy Technician

## 2012-02-15 ENCOUNTER — Inpatient Hospital Stay (HOSPITAL_COMMUNITY): Payer: Medicare Other

## 2012-02-15 LAB — BASIC METABOLIC PANEL
Calcium: 8.9 mg/dL (ref 8.4–10.5)
GFR calc non Af Amer: 70 mL/min — ABNORMAL LOW (ref >90)
Sodium: 139 mEq/L (ref 135–145)

## 2012-02-15 LAB — GLUCOSE, CAPILLARY: Glucose-Capillary: 98 mg/dL (ref 70–99)

## 2012-02-15 LAB — CULTURE, BLOOD (ROUTINE X 2)

## 2012-02-15 LAB — CK TOTAL AND CKMB (NOT AT ARMC): Total CK: 42 U/L (ref 7–177)

## 2012-02-15 LAB — CBC
Platelets: 254 10*3/uL (ref 150–400)
RBC: 2.83 MIL/uL — ABNORMAL LOW (ref 3.87–5.11)
RDW: 12.3 % (ref 11.5–15.5)
WBC: 7.6 10*3/uL (ref 4.0–10.5)

## 2012-02-15 LAB — TROPONIN I: Troponin I: <0.3 ng/mL (ref <0.30)

## 2012-02-15 MED ORDER — DIAZEPAM 5 MG PO TABS
5.0000 mg | ORAL_TABLET | ORAL | Status: AC
  Administered 2012-02-16: 5 mg via ORAL
  Filled 2012-02-15: qty 1

## 2012-02-15 MED ORDER — SODIUM CHLORIDE 0.9 % IV SOLN: 250.0000 mL | INTRAVENOUS | Status: DC | PRN

## 2012-02-15 MED ORDER — SODIUM CHLORIDE 0.9 % IJ SOLN: 3.0000 mL | INTRAMUSCULAR | Status: DC | PRN

## 2012-02-15 MED ORDER — SODIUM CHLORIDE 0.9 % IV SOLN
INTRAVENOUS | Status: DC
  Administered 2012-02-15: 21:00:00 via INTRAVENOUS

## 2012-02-15 MED ORDER — SODIUM CHLORIDE 0.9 % IJ SOLN
3.0000 mL | Freq: Two times a day (BID) | INTRAMUSCULAR | Status: DC
  Administered 2012-02-15: 3 mL via INTRAVENOUS

## 2012-02-15 MED ORDER — AMLODIPINE BESYLATE 5 MG PO TABS
5.0000 mg | ORAL_TABLET | Freq: Two times a day (BID) | ORAL | Status: DC
  Administered 2012-02-16: 5 mg via ORAL
  Filled 2012-02-15 (×3): qty 1

## 2012-02-15 MED ORDER — ASPIRIN 81 MG PO CHEW
324.0000 mg | CHEWABLE_TABLET | ORAL | Status: AC
  Administered 2012-02-16: 324 mg via ORAL
  Filled 2012-02-15: qty 4

## 2012-02-15 NOTE — Care Management Note (Signed)
    Page 1 of 1   02/15/2012     2:41:28 PM   CARE MANAGEMENT NOTE 02/15/2012  Patient:  RHEAGAN, NAYAK   Account Number:  [id number]  Date Initiated:  02/11/2012  Documentation initiated by:  Junius Creamer  Subjective/Objective Assessment:   adm w cardiogenic shock,pneumonia, presyncope     Action/Plan:   lives w fam, pcpdr leonard nyland   Anticipated DC Date:  02/17/2012   Anticipated DC Plan:  HOME/SELF CARE      DC Planning Services  CM consult  Outpatient Services - Pt will follow up      Choice offered to / List presented to:             Status of service:  In process, will continue to follow Medicare Important Message given?   (If response is "NO", the following Medicare IM given date fields will be blank) Date Medicare IM given:   Date Additional Medicare IM given:    Discharge Disposition:    Per UR Regulation:  Reviewed for med. necessity/level of care/duration of stay  If discussed at Long Length of Stay Meetings, dates discussed:    Comments:  9/19 8:43a debbie dowell rn,bsn [phone]  02/15/12- 1420- Donn Pierini RN, BSN [phone] Spoke with pt and spouse at bedside regarding PT recommendations for outpt PT at discharge. Pt agreable and wants to use therapy location up in [place].  MD please provide pt prescription for outpt PT at time of discharge if you agree. pt for cardiac cath in am. NCM to cont. to follow.

## 2012-02-15 NOTE — Progress Notes (Signed)
Pt c/o "not feeling well", she stated she was having gas, had heaviness in epigastric area that radiated to left shoulder.  Pt placed on 4L of oxygen per nasal cannula.  BP 162/82 and EKG obtained with no acute changes noted.  Dr. Sharyn Lull notified with new orders received.  Pt given maalox with relief.

## 2012-02-15 NOTE — Progress Notes (Signed)
Subjective:  Patient complains of retrosternal chest heaviness off and on associated with feeling weak and tired. States overall feeling better since admission  Objective:  Vital Signs in the last 24 hours: Temp:  [97.9 F (36.6 C)-98.5 F (36.9 C)] 98.3 F (36.8 C) (09/23 0500) Pulse Rate:  [60-76] 60  (09/23 1110) Resp:  [18-20] 18  (09/23 0500) BP: (150-186)/(72-86) 175/85 mmHg (09/23 1110) SpO2:  [92 %-94 %] 93 % (09/23 1110) Weight:  [62.188 kg (137 lb 1.6 oz)] 62.188 kg (137 lb 1.6 oz) (09/23 0500)  Intake/Output from previous day: 09/22 0701 - 09/23 0700 In: 670 [P.O.:670] Out: 1400 [Urine:1400] Intake/Output from this shift:    Physical Exam: Neck: no adenopathy, no carotid bruit, no JVD and supple, symmetrical, trachea midline Lungs: Decreased breath sound at bases with occasional rhonchi Heart: regular rate and rhythm, S1, S2 normal and Soft systolic murmur noted Abdomen: soft, non-tender; bowel sounds normal; no masses,  no organomegaly Extremities: extremities normal, atraumatic, no cyanosis or edema  Lab Results:  Basename 02/15/12 0454 02/13/12 0625  WBC 7.6 10.2  HGB 10.3* 9.2*  PLT 254 226    Basename 02/15/12 0454  NA 139  K 3.8  CL 103  CO2 26  GLUCOSE 100*  BUN 11  CREATININE 0.79    Basename 02/15/12 0454  TROPONINI <0.30   Hepatic Function Panel No results found for this basename: PROT,ALBUMIN,AST,ALT,ALKPHOS,BILITOT,BILIDIR,IBILI in the last 72 hours No results found for this basename: CHOL in the last 72 hours No results found for this basename: PROTIME in the last 72 hours  Imaging: Imaging results have been reviewed and No results found.  Cardiac Studies:  Assessment/Plan:  Status post Hypotensive shock probably secondary to sepsis/medications  Coronary artery disease history of probable silent MI in the past  Stable angina Hypertension  Non-insulin-dependent diabetes mellitus controlled by diet  Resolving acute diverticulitis   Resolving bilateral pneumonia  History of CVA  Chronic kidney disease  History of CA of bilateral breast  Status post symptomatic bradycardia probably secondary to medication rule out sick sinus syndrome/  Diarrhea improved  Anemia stable Plan Discussed with patient and her husband at length the various options of treatment i.e. medical versus invasive left cath possible PTCA stenting its risk and benefits i.e. death MI stroke need for emergency CABG local vascular complications etc. and consents for PCI  LOS: 5 days    Erica Austin N 02/15/2012, 12:19 PM

## 2012-02-15 NOTE — Progress Notes (Signed)
Occupational Therapy Treatment Patient Details Name: Erica Austin MRN: [id number] DOB: [birth year] Today's Date: 02/15/2012 Time: 0865-7846 OT Time Calculation (min): 18 min  OT Assessment / Plan / Recommendation Comments on Treatment Session Pt states "the doctor told me I am have a cath done tomorrow."  (did not see in chart).  Will f/u next session to ensure pt is progressing.  Pt is near baseline.    Follow Up Recommendations  No OT follow up;Supervision/Assistance - 24 hour    Barriers to Discharge       Equipment Recommendations  None recommended by OT    Recommendations for Other Services    Frequency Min 2X/week   Plan Discharge plan remains appropriate    Precautions / Restrictions Precautions Precautions: Fall   Pertinent Vitals/Pain See vitals    ADL  Grooming: Performed;Brushing hair;Wash/dry face;Set up Where Assessed - Grooming: Unsupported sitting Lower Body Dressing: Performed;Independent Where Assessed - Lower Body Dressing: Unsupported sitting Toilet Transfer: Simulated;Supervision/safety Toilet Transfer Method: Sit to Barista:  (bed) Equipment Used: Gait belt Transfers/Ambulation Related to ADLs: Ambulated from bed with close supervision for safety ADL Comments: Pt donned socks sitting EOB with ankles crossed over knees.  Pt is very talkative and requires redirection at times to stay on task.     OT Diagnosis:    OT Problem List:   OT Treatment Interventions:     OT Goals ADL Goals Pt Will Perform Grooming: Independently;Standing at sink ADL Goal: Grooming - Progress: Progressing toward goals Pt Will Perform Lower Body Dressing: Independently;Sit to stand from bed;Sit to stand from chair ADL Goal: Lower Body Dressing - Progress: Progressing toward goals Pt Will Transfer to Toilet: Independently;Ambulation ADL Goal: Toilet Transfer - Progress: Progressing toward goals  Visit Information  Last OT Received On:  02/15/12 Assistance Needed: +1    Subjective Data      Prior Functioning       Cognition       Mobility  Shoulder Instructions Bed Mobility Bed Mobility: Not assessed (sitting EOB upon OT arrival) Transfers Sit to Stand: 6: Modified independent (Device/Increase time);From bed Stand to Sit: 6: Modified independent (Device/Increase time);To bed       Exercises      Balance     End of Session OT - End of Session Activity Tolerance: Patient tolerated treatment well Patient left: with call bell/phone within reach;in bed (sitting EOB) Nurse Communication: Mobility status  GO   02/15/2012 Cipriano Mile OTR/L Pager [phone] Office [phone]   Cipriano Mile 02/15/2012, 11:03 AM

## 2012-02-16 ENCOUNTER — Encounter (HOSPITAL_COMMUNITY)
Admission: EM | Disposition: A | Discharge status: Home / Self Care | Payer: Self-pay | Source: Home / Self Care | Attending: Cardiology

## 2012-02-16 ENCOUNTER — Ambulatory Visit (HOSPITAL_COMMUNITY): Admission: RE | Admit: 2012-02-16 | Payer: Medicare Other | Source: Ambulatory Visit | Admitting: Cardiology

## 2012-02-16 HISTORY — PX: LEFT HEART CATHETERIZATION WITH CORONARY ANGIOGRAM: SHX5451

## 2012-02-16 LAB — GLUCOSE, CAPILLARY: Glucose-Capillary: 86 mg/dL (ref 70–99)

## 2012-02-16 SURGERY — LEFT HEART CATHETERIZATION WITH CORONARY ANGIOGRAM
Anesthesia: LOCAL

## 2012-02-16 MED ORDER — LOSARTAN POTASSIUM 50 MG PO TABS
50.0000 mg | ORAL_TABLET | Freq: Every morning | ORAL | Status: DC
  Administered 2012-02-16 – 2012-02-17 (×2): 50 mg via ORAL
  Filled 2012-02-16 (×2): qty 1

## 2012-02-16 MED ORDER — SODIUM CHLORIDE 0.9 % IV SOLN
INTRAVENOUS | Status: AC
  Administered 2012-02-16: 10:00:00 via INTRAVENOUS

## 2012-02-16 MED ORDER — NITROGLYCERIN 0.2 MG/ML ON CALL CATH LAB
INTRAVENOUS | Status: AC
  Filled 2012-02-16: qty 1

## 2012-02-16 MED ORDER — LIDOCAINE HCL (PF) 1 % IJ SOLN
INTRAMUSCULAR | Status: AC
  Filled 2012-02-16: qty 30

## 2012-02-16 MED ORDER — ACETAMINOPHEN 325 MG PO TABS: 650.0000 mg | ORAL_TABLET | ORAL | Status: DC | PRN

## 2012-02-16 MED ORDER — AZITHROMYCIN 600 MG PO TABS
600.0000 mg | ORAL_TABLET | Freq: Every day | ORAL | Status: DC
  Administered 2012-02-16: 600 mg via ORAL
  Filled 2012-02-16 (×2): qty 1

## 2012-02-16 MED ORDER — HEPARIN (PORCINE) IN NACL 2-0.9 UNIT/ML-% IJ SOLN
INTRAMUSCULAR | Status: AC
  Filled 2012-02-16: qty 500

## 2012-02-16 MED ORDER — METRONIDAZOLE 500 MG PO TABS
500.0000 mg | ORAL_TABLET | Freq: Three times a day (TID) | ORAL | Status: DC
  Administered 2012-02-16 – 2012-02-17 (×4): 500 mg via ORAL
  Filled 2012-02-16 (×6): qty 1

## 2012-02-16 MED ORDER — ONDANSETRON HCL 4 MG/2ML IJ SOLN: 4.0000 mg | Freq: Four times a day (QID) | INTRAMUSCULAR | Status: DC | PRN

## 2012-02-16 MED ORDER — HEPARIN (PORCINE) IN NACL 2-0.9 UNIT/ML-% IJ SOLN
INTRAMUSCULAR | Status: AC
  Filled 2012-02-16: qty 1000

## 2012-02-16 MED ORDER — ASPIRIN 81 MG PO CHEW: 81.0000 mg | CHEWABLE_TABLET | Freq: Every day | ORAL | Status: DC

## 2012-02-16 NOTE — Progress Notes (Signed)
PT Cancellation Note  Treatment cancelled today due to pt still on bedrest post Cardiac CAth.  Will try another time.    Sunny Schlein, [place] [phone] 02/16/2012, 1:32 PM

## 2012-02-16 NOTE — H&P (Signed)
  H&P in the chart no changes

## 2012-02-16 NOTE — CV Procedure (Signed)
Left cardiac cath report dictated on 02/16/2012 dictation number is [id number]

## 2012-02-17 LAB — CULTURE, BLOOD (ROUTINE X 2): Culture: NO GROWTH

## 2012-02-17 LAB — GLUCOSE, CAPILLARY: Glucose-Capillary: 109 mg/dL — ABNORMAL HIGH (ref 70–99)

## 2012-02-17 MED ORDER — FERROUS SULFATE 325 (65 FE) MG PO TABS: 325.0000 mg | ORAL_TABLET | Freq: Two times a day (BID) | ORAL | Status: DC

## 2012-02-17 MED ORDER — MOXIFLOXACIN HCL 400 MG PO TABS
400.0000 mg | ORAL_TABLET | Freq: Every day | ORAL | Status: DC
  Filled 2012-02-17: qty 1

## 2012-02-17 MED ORDER — AMLODIPINE BESYLATE 5 MG PO TABS: 5.0000 mg | ORAL_TABLET | Freq: Every day | ORAL | Status: DC

## 2012-02-17 MED ORDER — MOXIFLOXACIN HCL 400 MG PO TABS: 400.0000 mg | ORAL_TABLET | Freq: Every day | ORAL | Status: DC

## 2012-02-17 MED ORDER — METRONIDAZOLE 500 MG PO TABS: 500.0000 mg | ORAL_TABLET | Freq: Three times a day (TID) | ORAL | Status: DC

## 2012-02-17 MED ORDER — AMLODIPINE BESYLATE 5 MG PO TABS
5.0000 mg | ORAL_TABLET | Freq: Every day | ORAL | Status: DC
Start: 2012-02-17 — End: 2012-02-17
  Administered 2012-02-17: 5 mg via ORAL
  Filled 2012-02-17: qty 1

## 2012-02-17 MED ORDER — PANTOPRAZOLE SODIUM 40 MG PO TBEC: 40.0000 mg | DELAYED_RELEASE_TABLET | Freq: Every day | ORAL | Status: DC

## 2012-02-17 MED ORDER — ASPIRIN 81 MG PO CHEW: 81.0000 mg | CHEWABLE_TABLET | Freq: Every day | ORAL | Status: AC

## 2012-02-17 NOTE — Progress Notes (Signed)
PT Cancellation Note  Treatment cancelled today due to pt declining mobility this am stating she wants to save her energy for D/C home today.  Will f/u tomorrow if doesn't D/C.    Sunny Schlein, [place] [phone] 02/17/2012, 8:35 AM

## 2012-02-17 NOTE — Progress Notes (Signed)
Pt finger (1st digit on L hand) is red with small white-yellow spots that appear like pustules. Pt states finger is not sore and that she does not know what happened, but that she does have two spots on her upper lip that are sore.  MD notified to assess.  Efraim Kaufmann

## 2012-02-17 NOTE — Discharge Summary (Signed)
  Discharge summary dictated on 02/17/2012 dictation number is [id number]

## 2012-02-17 NOTE — Cardiovascular Report (Signed)
NAMEJAHNESSA, VANDUYN              ACCOUNT NO.:  [id number]  MEDICAL RECORD NO.:  [id number]  LOCATION:  3W01C                        FACILITY:  [facility]  PHYSICIAN:  Eduardo Osier. Sharyn Lull, M.D. DATE OF BIRTH:  25-Sep-1917  DATE OF PROCEDURE:  02/16/2012 DATE OF DISCHARGE:                           CARDIAC CATHETERIZATION   PROCEDURE:  Left cardiac cath with selective left and right coronary angiography, left ventriculography via right groin using Judkins technique.  INDICATION FOR THE PROCEDURE:  Ms. Cowman is 78 year old female with past medical history significant for hypertension, history of CVA with right eye visual loss, history of diverticulitis, hypercholesteremia, was transferred from [facility].  The patient complained of generalized weakness associated with near syncopal episode while at her niece's house.  She denies any chest pain, nausea, vomiting, diaphoresis.  Denies palpitation, lightheadedness, or syncopal episode. The patient also complains of left lower abdominal pain.  In the ER, the patient was noted to be hypotensive, bradycardic with junctional rhythm, received 1 mg IV atropine and fluid challenge with increase of blood pressure in 90s, subsequently was started on IV dopamine.  EKG done in the ER showed junctional rhythm with probable old inferior wall myocardial infarction and nonspecific ST-T wave changes.  The patient also gives history of exertional chest pressure, feels like heaviness and both arm pains off and on.  The patient was noted to be in septic shock, was treated with broad-spectrum antibiotics with resolution of her above symptoms.  Due to multiple risk factors and history of anginal chest pain, discussed with the patient and family regarding various options of treatment, i.e. medical versus invasive left cath, possible PTCA, stenting, its risks and benefits, i.e., death, MI, stroke, need for emergency CABG, local vascular complications,  etc., and consented for PCI.  PROCEDURE:  After obtaining the informed consent, the patient was brought to the Cath Lab and was placed on fluoroscopy table.  Right groin was prepped and draped in usual fashion.  Xylocaine 1% was used for local anesthesia in the right groin.  With the help of thin wall needle, 5-French arterial sheath was placed.  The sheath was aspirated and flushed.  Next, 5-French left Judkins catheter was advanced over the wire under fluoroscopic guidance up to the ascending aorta.  Wire was pulled out, the catheter was aspirated and connected to the Manifold. Catheter was further advanced and engaged into left coronary ostium. Multiple views of the left system were taken.  Next, the catheter was disengaged and was pulled out over the wire and was replaced with 5- [place] right Judkins catheter, which was advanced over the wire under fluoroscopic guidance up to the ascending aorta.  Wire was pulled out, the catheter was aspirated and connected to the Manifold.  Catheter was further advanced and engaged into right coronary ostium.  Multiple views of the right system were taken.  Next, the catheter was disengaged and was pulled out over the wire and was replaced with 5-French pigtail catheter, which was advanced over the wire under fluoroscopic guidance up to the ascending aorta.  Wire was pulled out, the catheter was aspirated and connected to the Manifold.  Catheter was further advanced across the aortic valve into  the LV.  LV pressures were recorded.  Next, left ventriculography was done in 30-degree RAO position.  Post- angiographic pressures were recorded from LV and then pullback pressures were recorded from aorta.  There was no gradient across the aortic valve.  Next, the pigtail catheter was pulled out over the wire. Sheaths were aspirated and flushed.  FINDINGS:  LV showed good LV systolic function, EF of 50-55%.  Left main was patent.  LAD has 10-15% proximal  and mid stenosis.  Diagonal 1 and 2 were very small.  Ramus was large, which was patent.  Left circumflex was patent.  High OM 1 was small, which was patent.  OM 2 was moderate size, which was patent.  RCA has 10- 15% mid stenosis, which was large vessel.  PDA and PLV branches were small, which were patent.  The patient tolerated the procedure well. There were no complications.  The patient was transferred to the recovery room in stable condition.     Eduardo Osier. Sharyn Lull, M.D.     MNH/MEDQ  D:  02/16/2012  T:  02/17/2012  Job:  [id number]

## 2012-02-17 NOTE — Progress Notes (Signed)
Pt discharged to home per MD order. Pt and husband received all discharge instructions and medication information including follow-up appointments and prescription information.  RN reviewed post-catheterization groin site care with pt and husband.  Pt alert and oriented at discharge with no complaints of pain.  Pt prescriptions called to [facility] prior to discharge.  Pt escorted to private vehicle via wheelchair by guest services.  Efraim Kaufmann

## 2012-02-17 NOTE — Discharge Summary (Signed)
Erica Austin, Erica Austin              ACCOUNT NO.:  [id number]  MEDICAL RECORD NO.:  [id number]  LOCATION:  3W01C                        FACILITY:  [facility]  PHYSICIAN:  Eduardo Osier. Sharyn Lull, M.D. DATE OF BIRTH:  [birth year]  DATE OF ADMISSION:  02/10/2012 DATE OF DISCHARGE:  02/17/2012                              DISCHARGE SUMMARY   ADMITTING DIAGNOSES: 1. Hypotensive shock probably secondary to sepsis/medication. 2. Coronary artery disease. 3. History of probable silent inferior wall myocardial infarction. 4. Acute diverticulitis. 5. Bilateral pneumonia. 6. History of cerebrovascular accident. 7. Chronic kidney disease. 8. History of cancer of the bilateral breast. 9. Symptomatic bradycardia secondary to medications, rule out sick     sinus syndrome.  DISCHARGE DIAGNOSES: 1. Status post septic shock. 2. Status post acute diverticulitis. 3. Status post bilateral pneumonia. 4. Mild coronary artery disease. 5. Status post acute renal failure. 6. History of cerebrovascular accident. 7. History of cancer of the bilateral breast. 8. Status post symptomatic bradycardia secondary to medications, i.e.     verapamil and timolol drops.  DISCHARGE HOME MEDICATIONS: 1. Amlodipine 5 mg 1 tablet daily. 2. Enteric-coated aspirin 81 mg 1 tablet daily. 3. Ferrous sulfate 325 mg twice daily. 4. Flagyl 500 mg three times daily for 5 more days. 5. Avelox 400 mg 1 tablet daily for 7 days. 6. Protonix 40 mg 1 tablet daily. 7. Tylenol 650 every 8 hours as needed as before. 8. Levothyroxine 25 mg 1 tablet daily as before. 9. Losartan 50 mg 1 tablet daily.  The patient has been advised to     stop verapamil, timolol eye drops and Citrucel.  DIET:  Low salt, low cholesterol.  ACTIVITY:  Increase activity as tolerated.  Follow up with me in 1 week.  CONDITION AT DISCHARGE:  Stable.  BRIEF HISTORY AND HOSPITAL COURSE:  Schmierer is a 78 year old female with past medical history significant for  hypertension, history of CVA with right eye vision loss, history of diverticulitis, was transferred from [facility].  The patient was complaining of generalized weakness associated with near syncopal episode.  She denied any chest pain, nausea, vomiting, diaphoresis, diabetes, palpitation, lightheadedness, or actual syncopal episode.  The patient also complains left lower abdominal pain.  Denies any diarrhea, vomiting in the ER. The patient was noted to be hypotensive, bradycardic with junctional rhythm, received 1 mg of IV atropine and fluid challenge with increase of blood pressures at 90s.  Subsequently had again dropped in blood pressure, requiring IV dopamine.  EKG done in the ER showed junctional rhythm, probable old inferior wall myocardial infarction, no acute ischemic changes were noted.  The patient was noted to have elevated white count and lactic acid.  Chest x-ray showed probable left lower lobe infiltrate and mild CK.  PAST MEDICAL HISTORY:  As above.  PAST SURGICAL HISTORY:  Abdominal hysterectomy in the past, cholecystectomy in the past, and bilateral mastectomy in the past.  No incision in the ER, incision at [facility] ICU.  PHYSICAL EXAMINATION:  VITAL SIGNS:  Blood pressure was 89/35, pulse was 56, respiration rate 23. HEENT:  Conjunctiva was pink.  Sclarea nonicteric. NECK:  Supple.  No JVD. LUNGS:  Bilateral rhonchi and  rales. CARDIOVASCULAR:  S1, S2 was soft.  There was soft systolic murmur and S3 gallop. ABDOMEN:  Soft.  Mildly distended.  There was mild tenderness in left lower quadrant.  No rebound. EXTREMITIES:  There was no clubbing, cyanosis, or edema.  LABORATORY DATA:  Sodium was 137, potassium 3.4, BUN 27, creatinine 1.48, glucose was 334.  Pro-BNP was 527.  Two sets of cardiac enzyme were negative.  Repeat electrolytes; sodium 140, potassium 4.0, BUN 26, creatinine 1.21.  Hemoglobin 9.3, hematocrit 28.4, white count of  15.1. Blood cultures were negative.  Urine cultures were negative.  Urine legionella antigen were negative.  Streptococcus pneumoniae urinary antigen were negative.  C. diff toxin was negative.  Her hemoglobin A1c was 5.6.  Chest x-ray showed left lower lobe infiltrate, minimal atelectasis, left base.  Repeat chest x-ray showed small bibasilar adhesion and right basilar atelectasis improving infiltrate in the left lower lobe.  Repeat chest x-ray on 22nd showed small left pleural effusion.  BRIEF HOSPITAL COURSE:  The patient was admitted to CCU.  Pancultures were obtained.  The patient was started on broad spectrum antibiotics. Dopamine was early being off.  Renal function gradually improved and normalized.  The patient did have episodes of chest pain during the hospital stay.  If __________ with insertion, subsequently required cardiac catheterization yesterday, which showed mild coronary artery disease with preserved LV systolic function.  Her labs yesterday; BUN 11, creatinine 0.79.  Her hemoglobin has been stable at 10.3, hematocrit 28.9, white count has back to baseline, 7.6.  The patient remained afebrile during the hospital stay.  Those procedures were __________ and no evidence of hematoma or bruit.  Initially was in 200s, but last few days has been staying in 90s to low 100s.  The patient is ambulating in the hallway and in the room without any problems.  The patient will be discharged home with family on above medications and will be followed up in my office in 1 week.     Eduardo Osier. Sharyn Lull, M.D.     MNH/MEDQ  D:  02/17/2012  T:  02/17/2012  Job:  [id number]

## 2013-10-24 DIAGNOSIS — K219 Gastro-esophageal reflux disease without esophagitis: Secondary | ICD-10-CM | POA: Insufficient documentation

## 2013-10-24 DIAGNOSIS — I10 Essential (primary) hypertension: Secondary | ICD-10-CM | POA: Insufficient documentation

## 2013-10-24 DIAGNOSIS — E538 Deficiency of other specified B group vitamins: Secondary | ICD-10-CM | POA: Insufficient documentation

## 2014-05-03 ENCOUNTER — Encounter (HOSPITAL_COMMUNITY): Payer: Self-pay | Admitting: Cardiology

## 2014-11-16 DIAGNOSIS — K5732 Diverticulitis of large intestine without perforation or abscess without bleeding: Secondary | ICD-10-CM | POA: Insufficient documentation

## 2014-12-27 ENCOUNTER — Encounter: Payer: Self-pay | Admitting: Gastroenterology

## 2017-02-03 ENCOUNTER — Encounter: Payer: Self-pay | Admitting: Nurse Practitioner

## 2017-02-17 ENCOUNTER — Ambulatory Visit (INDEPENDENT_AMBULATORY_CARE_PROVIDER_SITE_OTHER): Payer: Medicare Other | Admitting: Nurse Practitioner

## 2017-02-17 ENCOUNTER — Encounter: Payer: Self-pay | Admitting: Nurse Practitioner

## 2017-02-17 ENCOUNTER — Encounter (INDEPENDENT_AMBULATORY_CARE_PROVIDER_SITE_OTHER): Payer: Self-pay

## 2017-02-17 ENCOUNTER — Encounter: Payer: Self-pay | Admitting: Family Medicine

## 2017-02-17 VITALS — BP 130/70 | HR 74 | Ht 60.5 in | Wt 122.0 lb

## 2017-02-17 DIAGNOSIS — R1084 Generalized abdominal pain: Secondary | ICD-10-CM

## 2017-02-17 DIAGNOSIS — R52 Pain, unspecified: Secondary | ICD-10-CM

## 2017-02-17 NOTE — Patient Instructions (Addendum)
If you are age 81 or older, your body mass index should be between 23-30. Your Body mass index is 23.43 kg/m. If this is out of the aforementioned range listed, please consider follow up with your Primary Care Provider.  If you are age 42 or younger, your body mass index should be between 19-25. Your Body mass index is 23.43 kg/m. If this is out of the aformentioned range listed, please consider follow up with your Primary Care Provider.   INCREASE Miralax to three times daily for three days. Continue stool softener.  You are being referred to Dr. Gardenia Phlegm for abdominal wall pain.  Thank you for choosing me and [facility].   Tye Savoy, NP

## 2017-02-17 NOTE — Progress Notes (Addendum)
HPI:  Patient is as 83 year old female, previously followed years ago by Dr. Sharlett Austin for colon polyps.  She also has a remote history of sigmoid diverticulitis. She has pernicious anemia, HTN, and thyroid disease. She is here for evaluation of right sided abdominal pain. The burning pain started ~ two months ago. It is nearly constant, not related to eating in any way. At times it can be worse if she twists or bends but not always. The burning pain starts at waist and runs in a line toward umbilicus.  Tylenol and heating pad help. Initially patient thought of shingles but never had any skin manifestations   Records reviewed in Care Everywhere  Aug 19th [facility] ED for right flank pain. Non-contrast CT scan (stone protocol) revealed right upper pole stones during 1.8 cm. A 4.2 cm lesion was seen in the left kidney. Appendix not seen. Large stool burden present. CBC was at baseline including a hemoglobin of 11.2. Chemistries were unremarkable with creatinine at baseline. U/A was normal.   She was treated with MiraLAX for constipation and Ultram for pain. Referred to urology regarding renal stones. Per son, Urology didn't think her pain was urologic and the renal mass not felt to be malignant.   01/31/17 back to [facility] ED with same pain. CT scan with contrast done and no new findings. Repeat labs still unremarkable. Bowels were moving daily with miralax at that time.    Patient is here with her son. She continues to have the constant right mid abdominal burning. She has no back pain. No lower extremity weakness. No loss of bowel or bladder function. No nausea.    Past Medical History:  Diagnosis Date  . Breast cancer ([place])   . Diverticulitis   . Heart size increased      Past Surgical History:  Procedure Laterality Date  . ABDOMINAL HYSTERECTOMY    . CHOLECYSTECTOMY    . LEFT HEART CATHETERIZATION WITH CORONARY ANGIOGRAM N/A 02/16/2012   Procedure: LEFT HEART CATHETERIZATION WITH  CORONARY ANGIOGRAM;  Surgeon: Clent Demark, MD;  Location[facility] CATH LAB;  Service: Cardiovascular;  Laterality: N/A;  . MASTECTOMY Bilateral    No family history on file. Social History  Substance Use Topics  . Smoking status: Never Smoker  . Smokeless tobacco: Never Used  . Alcohol use No   Current Outpatient Prescriptions  Medication Sig Dispense Refill  . Acetaminophen 650 MG TABS Take 650 mg by mouth every 8 (eight) hours as needed. For arthritis pain    . aspirin 81 MG chewable tablet Chew 1 tablet (81 mg total) by mouth daily. 30 tablet 3  . ferrous sulfate 325 (65 FE) MG tablet Take 1 tablet (325 mg total) by mouth 2 (two) times daily with a meal. 60 tablet 3  . gabapentin (NEURONTIN) 300 MG capsule Take 1 capsule by mouth 2 (two) times daily.    Marland Kitchen levothyroxine (SYNTHROID, LEVOTHROID) 50 MCG tablet Take 50 mcg by mouth every morning.     . pantoprazole (PROTONIX) 40 MG tablet Take 1 tablet (40 mg total) by mouth daily at 12 noon. 30 tablet 3   No current facility-administered medications for this visit.    Allergies  Allergen Reactions  . Codeine Nausea And Vomiting     Review of Systems: All systems reviewed and negative except where noted in HPI.    Physical Exam: BP 130/70   Pulse 74   Ht 5' 0.5" (1.537 m)   Wt  122 lb (55.3 kg)   BMI 23.43 kg/m  Constitutional:  Well-developed, white female in no acute distress. Psychiatric: Normal mood and affect. Behavior is normal. EENT: Pupils normal.  Conjunctivae are normal. No scleral icterus. Neck supple.  Cardiovascular: Normal rate, regular rhythm. No edema Pulmonary/chest: Effort normal and breath sounds normal. No wheezing, rales or rhonchi. Abdominal: Soft, nondistended. Mild discomfort with pinch test right mid abdomen.  Bowel sounds active throughout. There are no masses palpable. No hepatomegaly. Lymphadenopathy: No cervical adenopathy noted. Neurological: Alert and oriented to person place and time. Skin:  Skin is warm and dry. No rashes noted.   ASSESSMENT AND PLAN:  83 yo female with a two month history of constant right mid abdominal burning pain. Distribution of pain is in a straight line from her right waist to the umbilicus. The pain is unrelated to eating or BMs. No lesions to suggest shingles. Tylenol and heat help. Query abdominal cutaneous nerve entrapment syndrome.  -She may have been previously constipated but sounds like that has resolved. To sort things out I would like to remove constipation from the equation all together . For next 2-3 days she will increase Miralax from 2 times a day to 3 times a day. -The pain really doesn't sound GI. Will refer to Dr. Charlann Austin for consideration of a local injection of an anesthetic.  -If no improvement will consider trial of Nortriptyline for neuropathic pain.  -Patient will let us know if she develops any associated abdominal pain, nausea, vomiting or other GI symptoms   Erica Savoy, NP  02/17/2017, 11:41 AM  Cc:  Erica Housekeeper, MD  Addendum: Reviewed and agree with initial management. Given localized neuropathic type pain as described to Erica Savoy, NP, I agree with consideration/empiric treatment of anterior cutaneous nerve pain.  If local injection totally alleviates pain, then diagnosis is secured. If not, then agree with trial of pharmacotherapy  Pyrtle, Lajuan Lines, MD

## 2017-03-11 ENCOUNTER — Encounter: Payer: Self-pay | Admitting: Family Medicine

## 2017-03-11 ENCOUNTER — Ambulatory Visit (INDEPENDENT_AMBULATORY_CARE_PROVIDER_SITE_OTHER): Payer: Medicare Other | Admitting: Family Medicine

## 2017-03-11 DIAGNOSIS — M792 Neuralgia and neuritis, unspecified: Secondary | ICD-10-CM | POA: Insufficient documentation

## 2017-03-11 MED ORDER — VALACYCLOVIR HCL 500 MG PO TABS: 500.0000 mg | ORAL_TABLET | Freq: Two times a day (BID) | ORAL | 0 refills | Status: AC

## 2017-03-11 NOTE — Patient Instructions (Addendum)
Good to see you  Think it is neuropathic pain and think  pennsaid pinkie amount topically 2 times daily as needed.  Valtrex 2 times daily for 2 weeks.  See me again in 3 weeks to make sure it calms down

## 2017-03-11 NOTE — Progress Notes (Signed)
[facility] [address], [place] [id number] Phone: [phone] Subjective:    I'm seeing this patient by the request  of:    CC: Side bending  [id number]  Erica Austin is a 83 y.o. female coming in right lower abdomen. She has been having the pain for 3 months. She has been in an out of doctors offices that are not sure what has been going on. She is having constant pain and also describes a burning pain in that area. She has been using Aleve for the pain which does help. She has been using a stool softener to try to help with the pain. She uses a heating pad for the pain. Patient rates the severity of pain sometimes is 8 out of 10.    Patient did have some previous imaging. Has been in the emergency department on 2 separate occasions secondary to the amount of pain. Patient did have a noncontrast CT scan. This was independently visualized by me and did have a 4.2 cm lesion on the left kidney. Large stool burden. Patient referred to neurology. We do not have theseurology stated that they do not feel that the mass was malignant. Patient unfortunately had to go back to the emergency room 6/9 2018 and they did do a CT scan with contrast showing the patient's renal mass had increased in size to 4.6.      Past Medical History:  Diagnosis Date  . Breast cancer ([place])   . Diverticulitis   . Heart size increased    Past Surgical History:  Procedure Laterality Date  . ABDOMINAL HYSTERECTOMY    . CHOLECYSTECTOMY    . LEFT HEART CATHETERIZATION WITH CORONARY ANGIOGRAM N/A 02/16/2012   Procedure: LEFT HEART CATHETERIZATION WITH CORONARY ANGIOGRAM;  Surgeon: Clent Demark, MD;  Location: [facility] CATH LAB;  Service: Cardiovascular;  Laterality: N/A;  . MASTECTOMY Bilateral    Social History   Social History  . Marital status: Married    Spouse name: N/A  . Number of children: 2  . Years of education: N/A   Occupational History  . retired     Social History Main Topics  . Smoking status: Never Smoker  . Smokeless tobacco: Never Used  . Alcohol use No  . Drug use: No  . Sexual activity: Not Asked   Other Topics Concern  . None   Social History Narrative  . None   Allergies  Allergen Reactions  . Codeine Nausea And Vomiting   No family history on file. No family history of autoimmune diseases   Past medical history, social, surgical and family history all reviewed in electronic medical record.  No pertanent information unless stated regarding to the chief complaint.   Review of Systems:Review of systems updated and as accurate as of 03/11/17  No visual changes, nausea, vomiting, diarrhea, dizziness, skin rash, fevers, chills, night sweats, weight loss, swollen lymph nodes, body aches, joint swelling, muscle aches, chest pain, shortness of breath, mood changes. Positive headaches, muscle aches, body aches, abdominal pain and constipation  Objective  Blood pressure 132/82, pulse 84, height 5' 0.5" (1.537 m), weight 121 lb (54.9 kg), SpO2 96 %. Systems examined below as of 03/11/17   General: No apparent distress alert and oriented x3 mood and affect normal, dressed appropriately.  HEENT: Pupils equal, extraocular movements intact  Respiratory: Patient's speak in full sentences and does not appear short of breath  Cardiovascular: No lower extremity edema, non tender, no  erythema  Skin: Warm dry intact with no signs of infection or rash on extremities or on axial skeleton.  Abdomen: Soft Distended. Patient is tender to palpation even to light palpation on the right side in the umbilical area and going down to the flank. No masses appreciated. Patient though does have what appears to be some mild ascites. Neuro: Cranial nerves II through XII are intact, neurovascularly intact in all extremities with 2+ DTRs and 2+ pulses.  Lymph: No lymphadenopathy of posterior or anterior cervical chain or axillae bilaterally.  Gait  normal with good balance and coordination.  MSK:  Non tender with full range of motion and good stability and symmetric strength and tone of shoulders, elbows, wrist, hip, knee and ankles bilaterally. Arthritic changes of multiple    Impression and Recommendations:     This case required medical decision making of moderate complexity.      Note: This dictation was prepared with Dragon dictation along with smaller phrase technology. Any transcriptional errors that result from this process are unintentional.

## 2017-03-11 NOTE — Assessment & Plan Note (Signed)
I believe the patient's abdominal pain is likely neuropathic. Possibly a postviral neuritis. No rash of this time but patient pain seems to be selective dermatome. We discussed with patient about topical anti-inflammatories and icing regimen. In addition of this patient will be put on Valtrex to see if this will help out for the next 2 weeks. I do not want to increase the gabapentin and encourage her to actually discontinue her decreased secondary to patient's difficult he with constipation. Patient is going to continue all other regimens at this time. Patient will follow-up with me again in 3 weeks to make sure it is completely resolved. That the injections would be beneficial at this time.

## 2017-04-01 ENCOUNTER — Encounter: Payer: Self-pay | Admitting: Family Medicine

## 2017-04-01 ENCOUNTER — Ambulatory Visit (INDEPENDENT_AMBULATORY_CARE_PROVIDER_SITE_OTHER): Payer: Medicare Other | Admitting: Family Medicine

## 2017-04-01 DIAGNOSIS — M792 Neuralgia and neuritis, unspecified: Secondary | ICD-10-CM | POA: Diagnosis not present

## 2017-04-01 MED ORDER — ACYCLOVIR 400 MG PO TABS: 400.0000 mg | ORAL_TABLET | Freq: Three times a day (TID) | ORAL | 1 refills | Status: AC

## 2017-04-01 NOTE — Progress Notes (Signed)
[facility] [address] [place], [place] [address] Phone: [phone] Subjective:    I'm seeing this patient by the request  of:    CC:   [id number]  Erica Austin is a 83 y.o. female coming in for follow up for abdominal pain/shingles. She has improved since the last visit and is wondering if she needs to continue with the medication for shingles. Patient was initially seen for more than a right-sided abdominal pain.  Was a more neuropathic pain.  He is doing much better.  States that she is feeling significantly better.     Past Medical History:  Diagnosis Date  . Breast cancer ([place])   . Diverticulitis   . Heart size increased    Past Surgical History:  Procedure Laterality Date  . ABDOMINAL HYSTERECTOMY    . CHOLECYSTECTOMY    . MASTECTOMY Bilateral    Social History   Socioeconomic History  . Marital status: Married    Spouse name: None  . Number of children: 2  . Years of education: None  . Highest education level: None  Social Needs  . Financial resource strain: None  . Food insecurity - worry: None  . Food insecurity - inability: None  . Transportation needs - medical: None  . Transportation needs - non-medical: None  Occupational History  . Occupation: retired  Tobacco Use  . Smoking status: Never Smoker  . Smokeless tobacco: Never Used  Substance and Sexual Activity  . Alcohol use: No  . Drug use: No  . Sexual activity: None  Other Topics Concern  . None  Social History Narrative  . None   Allergies  Allergen Reactions  . Codeine Nausea And Vomiting   No family history on file.   Past medical history, social, surgical and family history all reviewed in electronic medical record.  No pertanent information unless stated regarding to the chief complaint.   Review of Systems:Review of systems updated and as accurate as of 04/01/17  No headache, visual changes, nausea, vomiting, diarrhea, constipation,  dizziness, abdominal pain, skin rash, fevers, chills, night sweats, weight loss, swollen lymph nodes, body aches, joint swelling, muscle aches, chest pain, shortness of breath, mood changes.   Objective  Blood pressure 120/66, pulse 76, weight 121 lb (54.9 kg), SpO2 95 %. Systems examined below as of 04/01/17   General: No apparent distress alert and oriented x3 mood and affect normal, dressed appropriately.  HEENT: Pupils equal, extraocular movements intact  Respiratory: Patient's speak in full sentences and does not appear short of breath  Cardiovascular: No lower extremity edema, non tender, no erythema  Skin: Warm dry intact with no signs of infection or rash on extremities or on axial skeleton.  Abdomen: Soft nontender no rash noted. Neuro: Cranial nerves II through XII are intact, neurovascularly intact in all extremities with 2+ DTRs and 2+ pulses.  Lymph: No lymphadenopathy of posterior or anterior cervical chain or axillae bilaterally.  Gait mild unsteady gait MSK: Mild tender with full range of motion and good stability and symmetric strength and tone of shoulders, elbows, wrist, hip, knee and ankles bilaterally.  Arthritic changes in multiple joints    Impression and Recommendations:     This case required medical decision making of moderate complexity.      Note: This dictation was prepared with Dragon dictation along with smaller phrase technology. Any transcriptional errors that result from this process are unintentional.

## 2017-04-01 NOTE — Assessment & Plan Note (Signed)
Much better at this time.  Seems to be shingles.  Patient given a refill of the Valtrex if needed.  If worsening symptoms to come back immediately otherwise follow-up as needed

## 2020-09-22 DEATH — deceased
